# Patient Record
Sex: Male | Born: 1960 | Race: Black or African American | Hispanic: No | Marital: Married | Smoking: Never smoker
Health system: Southern US, Community
[De-identification: ages and names within clinical notes are randomized; demographics above are authoritative.]

## PROBLEM LIST (undated history)

## (undated) DIAGNOSIS — J302 Other seasonal allergic rhinitis: Secondary | ICD-10-CM

## (undated) DIAGNOSIS — I10 Essential (primary) hypertension: Secondary | ICD-10-CM

## (undated) HISTORY — PX: HERNIA REPAIR: SHX51

## (undated) HISTORY — DX: Essential (primary) hypertension: I10

## (undated) HISTORY — PX: OTHER SURGICAL HISTORY: SHX169

---

## 2014-06-20 ENCOUNTER — Emergency Department: Payer: Self-pay | Admitting: Emergency Medicine
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
## 2014-06-23 ENCOUNTER — Emergency Department: Payer: Self-pay | Admitting: Emergency Medicine
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
## 2014-11-16 ENCOUNTER — Emergency Department: Payer: Self-pay | Admitting: Emergency Medicine

## 2014-11-16 LAB — CBC
HCT: 39.8 % — ABNORMAL LOW (ref 40.0–52.0)
HGB: 12.6 g/dL — ABNORMAL LOW (ref 13.0–18.0)
MCH: 24.9 pg — AB (ref 26.0–34.0)
MCHC: 31.6 g/dL — ABNORMAL LOW (ref 32.0–36.0)
MCV: 79 fL — ABNORMAL LOW (ref 80–100)
Platelet: 261 10*3/uL (ref 150–440)
RBC: 5.07 10*6/uL (ref 4.40–5.90)
RDW: 15.8 % — ABNORMAL HIGH (ref 11.5–14.5)
WBC: 4.5 10*3/uL (ref 3.8–10.6)

## 2014-11-16 IMAGING — CR DG CHEST 2V
1 series · 2 of 2 positions shown · non-contrast
Comparison: [DATE].

CLINICAL DATA: Chest pain radiating to the back. Initial encounter.

[Series 1: dxr chest pa (or ap) and lateral · 0.14mm/px · 2 of 2 slices shown]
[im 1/2]
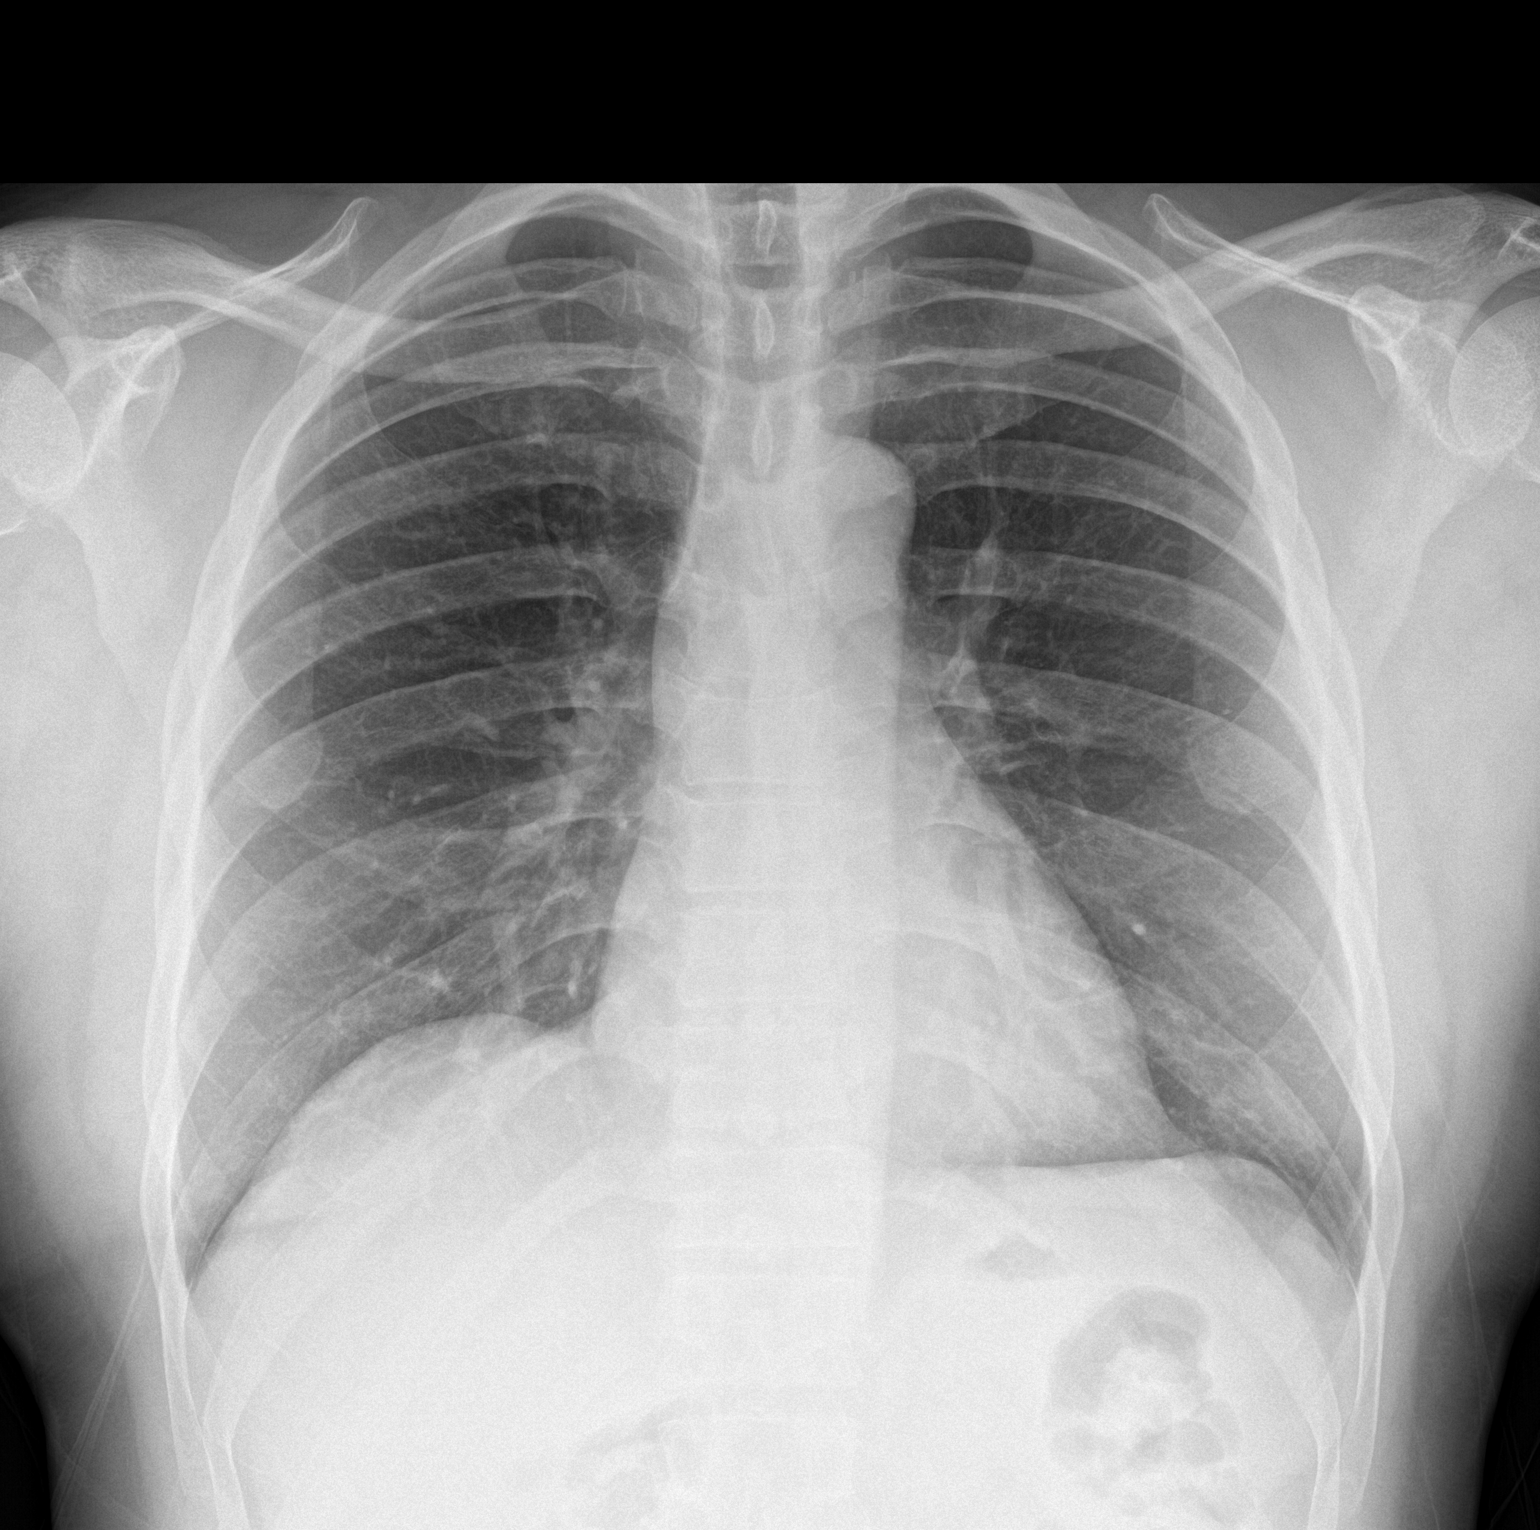
[im 2/2]
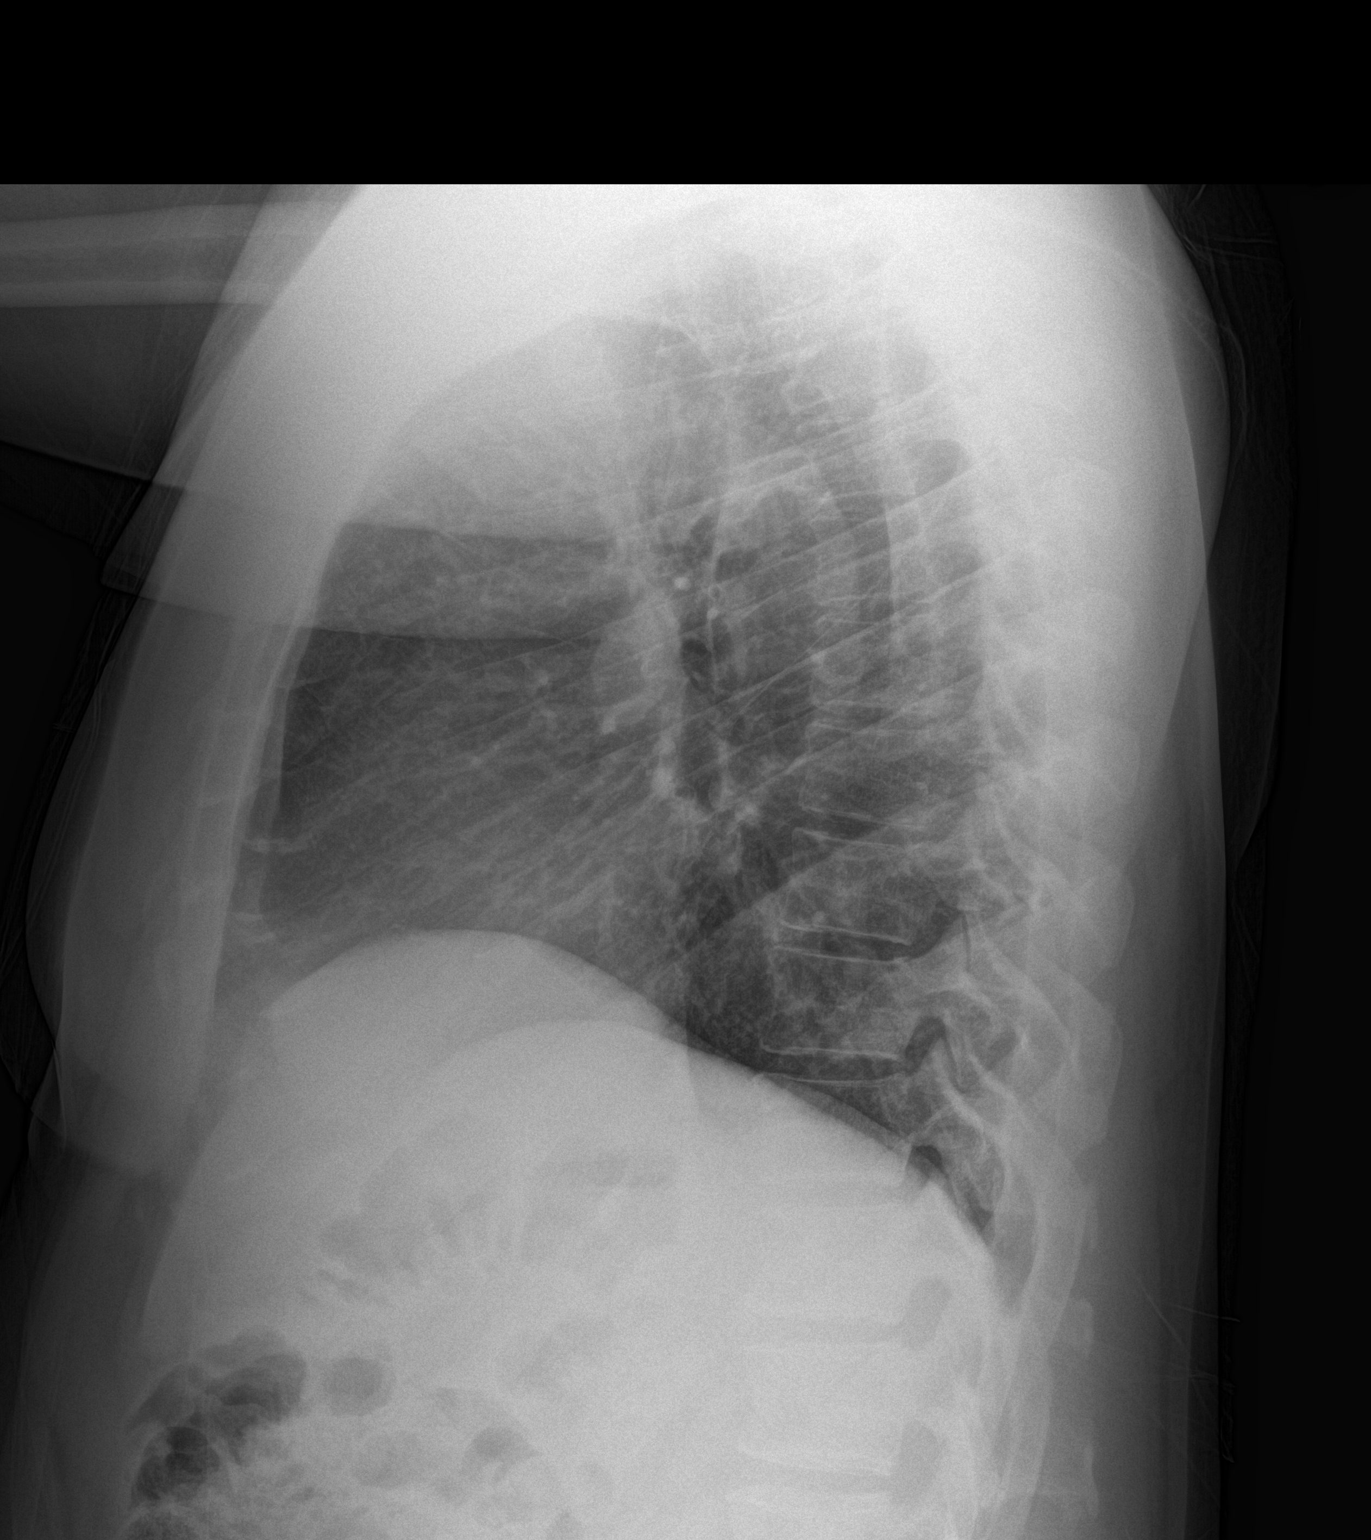

[2 of 2 positions shown; findings below may reference images not displayed]

radiates through his chest to his back. Patient began having
increased chest pain earlier today and pain became more centralized
along his sternum. Patient states he had flu like symptoms 3 weeks
ago that came and went very quickly but no other HX of any heart or
lung disease in the past. Patient is a nonsmoker.

EXAM:
CHEST  2 VIEW
FINDINGS: Cardiopericardial silhouette within normal limits. Mediastinal
contours normal. Trachea midline. No airspace disease or effusion.
IMPRESSION: No active cardiopulmonary disease.

## 2015-03-13 IMAGING — US US ABDOMEN LIMITED
1 series · 14 of 25 positions shown · non-contrast
Comparison: None.

CLINICAL DATA: Acute onset of severe right upper quadrant and
epigastric abdominal pain. Nausea and vomiting. Initial encounter.

EXAM:
US ABDOMEN LIMITED - RIGHT UPPER QUADRANT

[Series 1: us abdomen limited · 0.25mm/px · 14 of 37 slices shown]
[im 1/37]
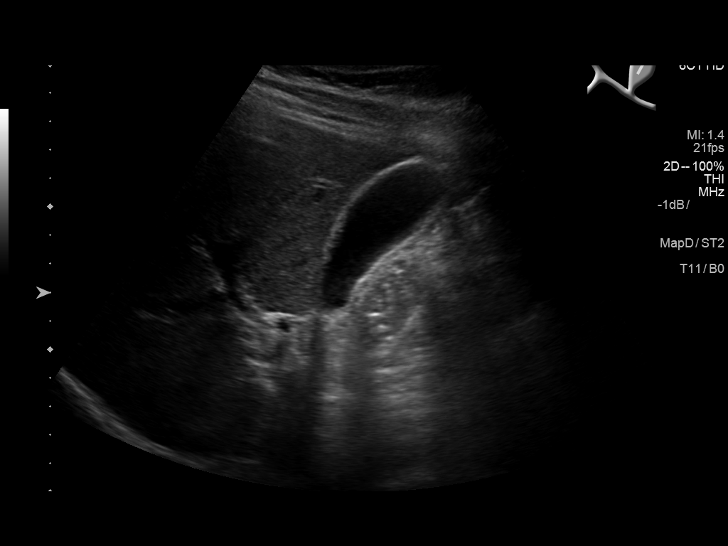
[im 4/37]
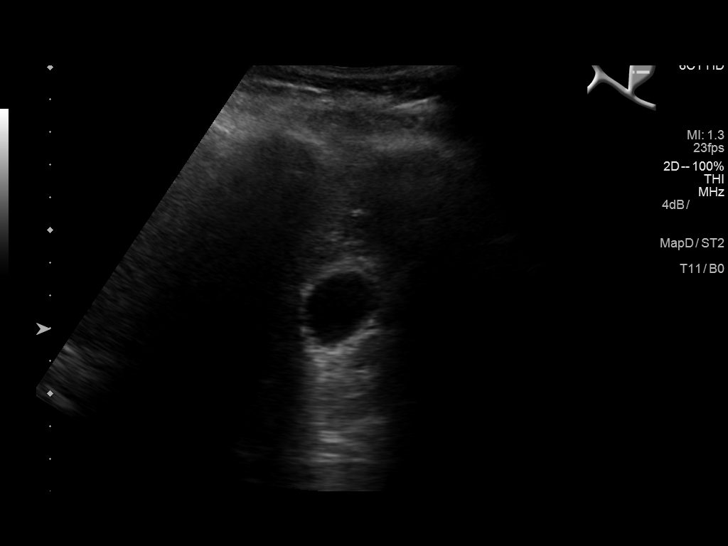
[im 7/37]
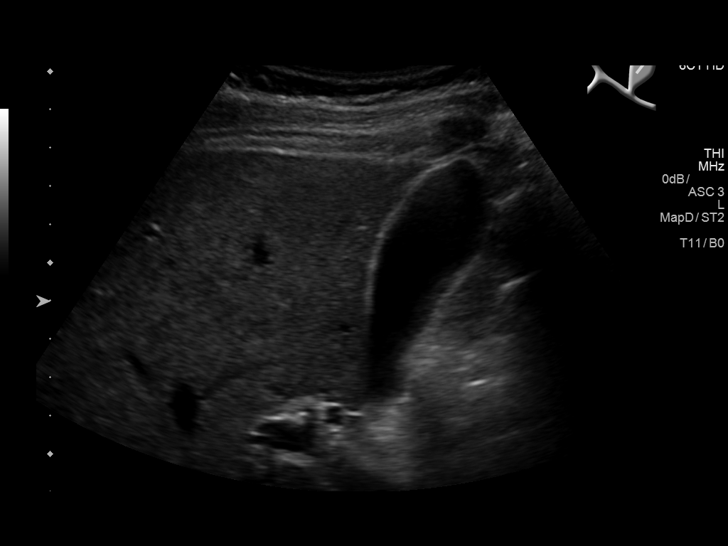
[im 10/37]
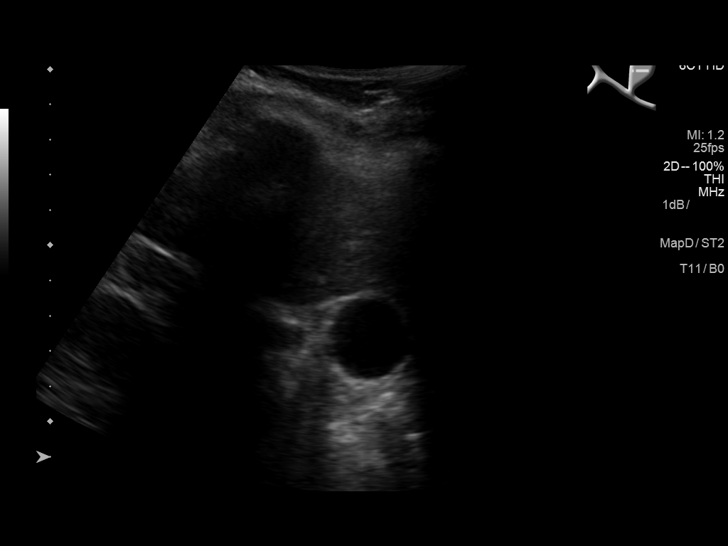
[im 13/37]
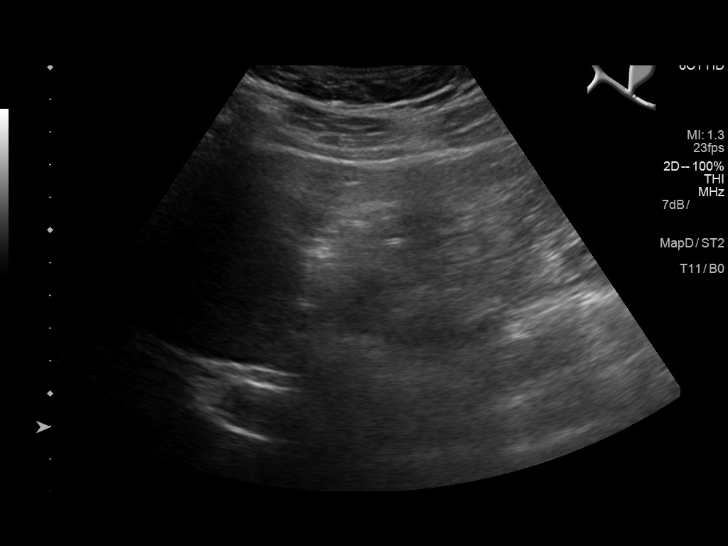
[im 14/37]
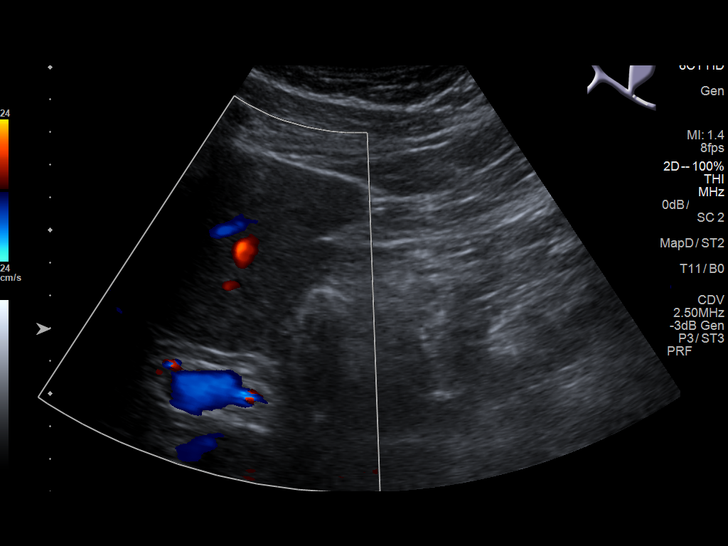
[im 17/37]
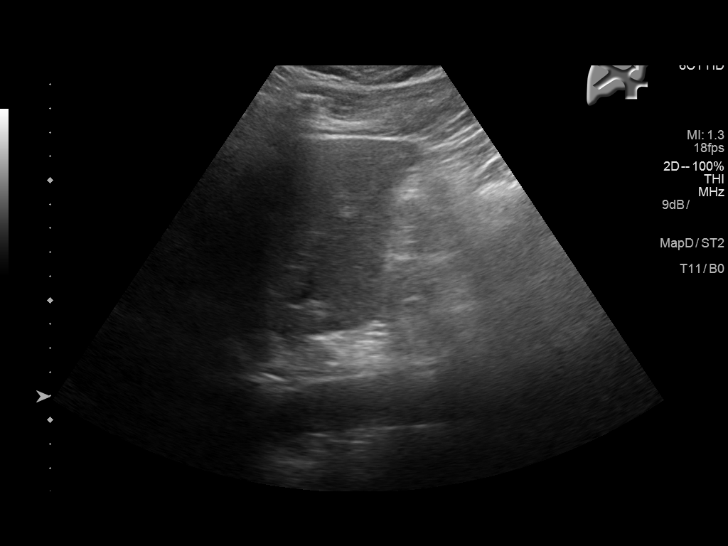
[im 20/37]
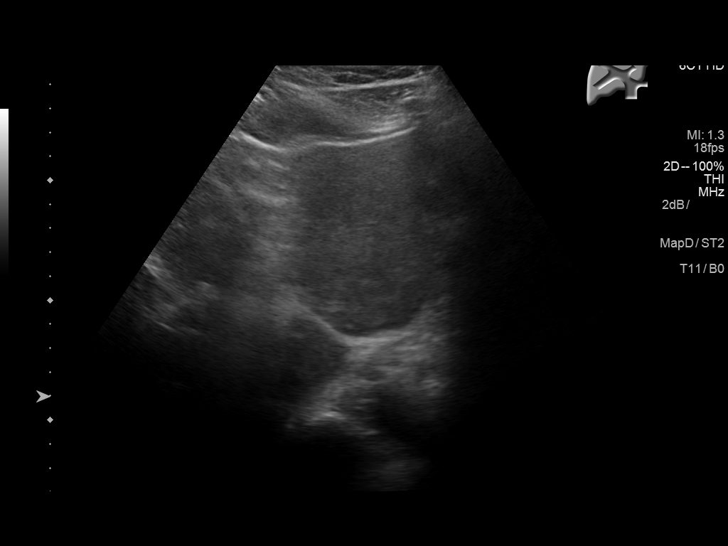
[im 23/37]
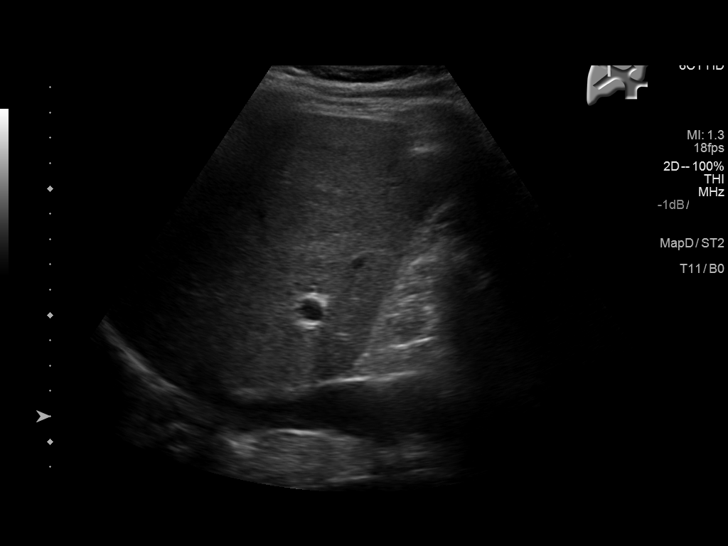
[im 25/37]
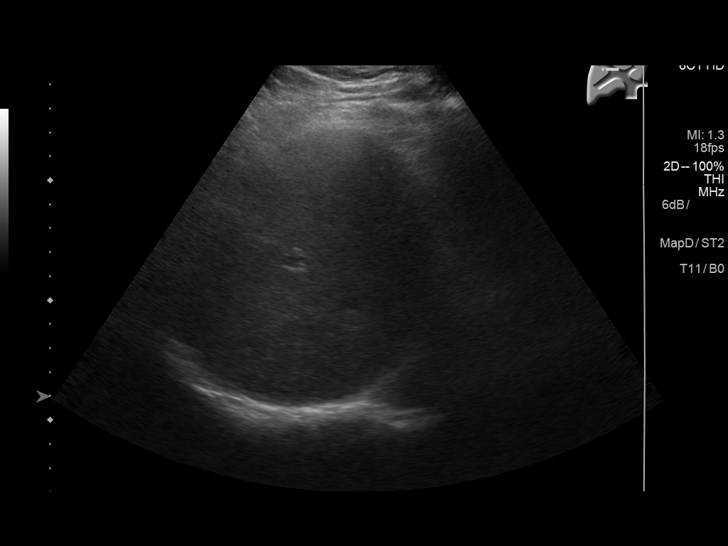
[im 28/37]
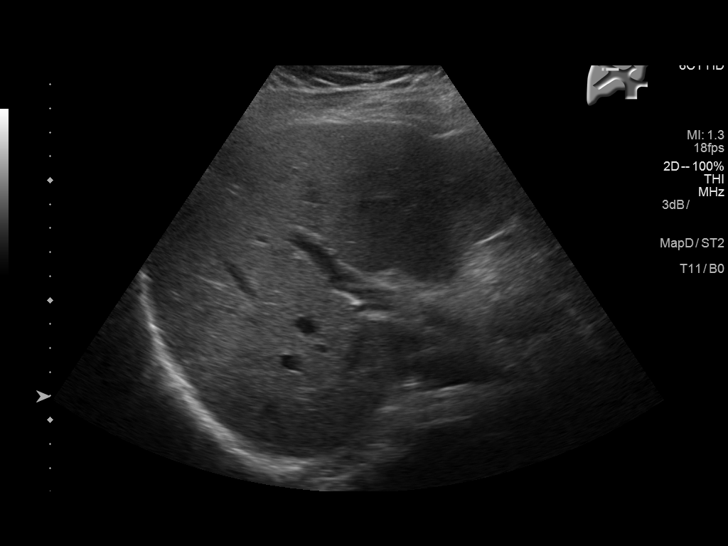
[im 31/37]
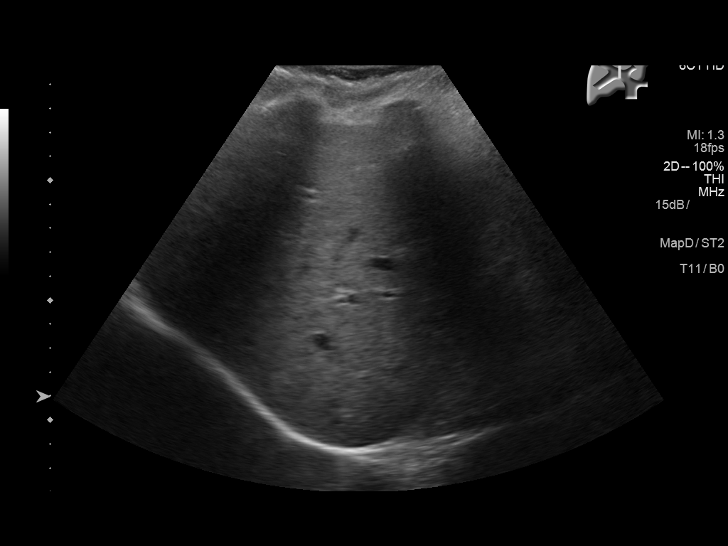
[im 34/37]
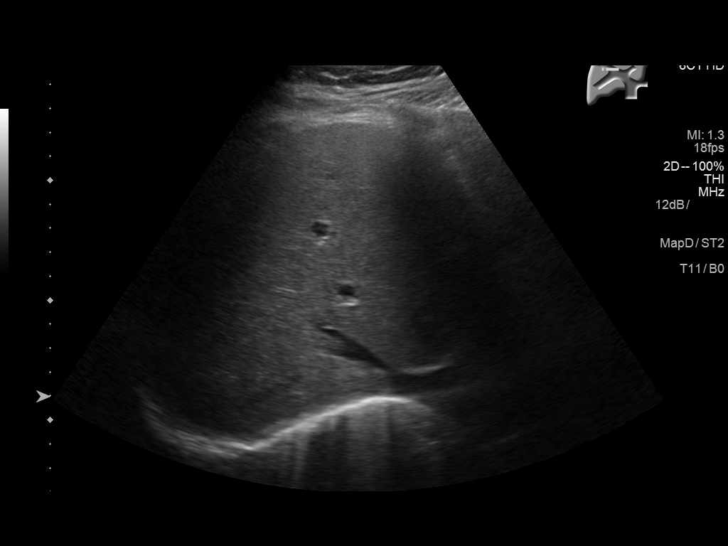
[im 37/37]
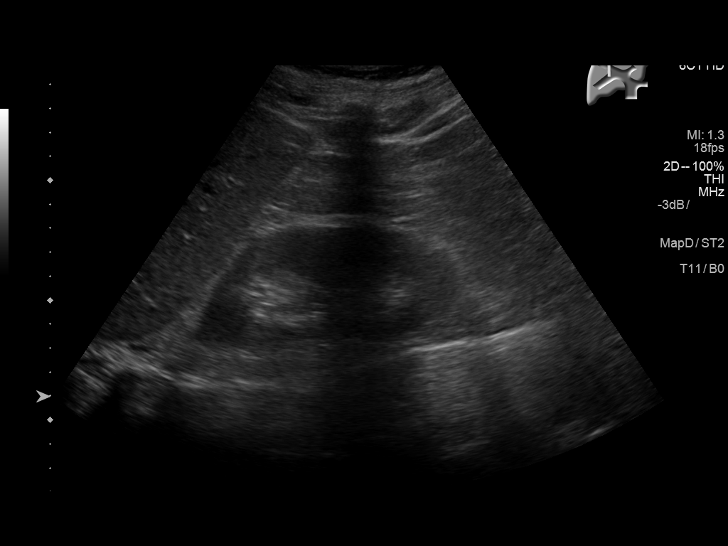

[14 of 25 positions shown; findings below may reference images not displayed]

FINDINGS: Gallbladder:

No gallstones or wall thickening visualized. No sonographic Murphy
sign noted by sonographer.

Common bile duct:

Diameter: 0.4 cm, within normal limits in caliber.

Liver:

No focal lesion identified. Within normal limits in parenchymal
echogenicity.
IMPRESSION: Unremarkable ultrasound of the right upper quadrant.

## 2015-04-03 LAB — BASIC METABOLIC PANEL
ANION GAP: 8 (ref 7–16)
BUN: 17 mg/dL
CREATININE: 1.12 mg/dL
Calcium, Total: 8.7 mg/dL — ABNORMAL LOW
Chloride: 104 mmol/L
Co2: 25 mmol/L
EGFR (Non-African Amer.): >60
GLUCOSE: 100 mg/dL — AB
Potassium: 4 mmol/L
Sodium: 137 mmol/L

## 2015-04-03 LAB — TROPONIN I

## 2015-04-03 LAB — PRO B NATRIURETIC PEPTIDE: B-TYPE NATIURETIC PEPTID: 17 pg/mL

## 2015-07-21 ENCOUNTER — Emergency Department
Admission: EM | Admit: 2015-07-21 | Discharge: 2015-07-21 | Disposition: A | Discharge status: Home / Self Care | Payer: BC Managed Care – PPO | Attending: Emergency Medicine | Admitting: Emergency Medicine

## 2015-07-21 DIAGNOSIS — R0989 Other specified symptoms and signs involving the circulatory and respiratory systems: Secondary | ICD-10-CM | POA: Diagnosis present

## 2015-07-21 DIAGNOSIS — B349 Viral infection, unspecified: Secondary | ICD-10-CM | POA: Insufficient documentation

## 2015-07-21 NOTE — ED Notes (Signed)
Pt c/o cough with congestion for the past week

## 2015-07-21 NOTE — ED Notes (Signed)
AAOx3.  Skin warm and dry. MAE equally and strong.

## 2015-07-21 NOTE — ED Provider Notes (Signed)
[facility] Emergency Department Provider Note  ____________________________________________  Time seen: On arrival  I have reviewed the triage vital signs and the nursing notes.   HISTORY  Chief Complaint URI    HPI Corey Patterson is a 54 y.o. male who presents with mild ear discomfort, scratchy throat, body aches and low energy for approximately one week. He does not know what causing this. He denies recent travel, no sick contacts. No fevers or chills. No nausea vomiting abdominal pain.    History reviewed. No pertinent past medical history.  There are no active problems to display for this patient.   Past Surgical History  Procedure Laterality Date  . Hernia repair      No current outpatient prescriptions on file.  Allergies Review of patient's allergies indicates no known allergies.  No family history on file.  Social History Social History  Substance Use Topics  . Smoking status: Never Smoker   . Smokeless tobacco: None  . Alcohol Use: No    Review of Systems  Constitutional: Negative for fever. Eyes: Negative for visual changes. ENT: Positive for scratchy throat  Abdominal: No abdominal pain  Musculoskeletal: Negative for back pain. Positive for myalgias Skin: Negative for rash. Neurological: Negative for headaches or focal weakness   ____________________________________________   PHYSICAL EXAM:  VITAL SIGNS: ED Triage Vitals  Enc Vitals Group     BP 07/21/15 1600 146/76 mmHg     Pulse Rate 07/21/15 1600 95     Resp 07/21/15 1600 18     Temp 07/21/15 1600 98.6 F (37 C)     Temp Source 07/21/15 1600 Oral     SpO2 07/21/15 1600 98 %     Weight 07/21/15 1600 226 lb (102.513 kg)     Height 07/21/15 1600 5\' 11"  (1.803 m)     Head Cir --      Peak Flow --      Pain Score 07/21/15 1601 2     Pain Loc --      Pain Edu? --      Excl. in GC? --      Constitutional: Alert and oriented. Well appearing and in no  distress. Eyes: Conjunctivae are normal.  ENT   Head: Normocephalic and atraumatic.   Mouth/Throat: Mucous membranes are moist. Cardiovascular: Normal rate, regular rhythm.  Respiratory: Normal respiratory effort without tachypnea nor retractions.  Gastrointestinal: Soft and non-tender in all quadrants. No distention. There is no CVA tenderness. Musculoskeletal: Nontender with normal range of motion in all extremities. Neurologic:  Normal speech and language. No gross focal neurologic deficits are appreciated. Skin:  Skin is warm, dry and intact. No rash noted. Psychiatric: Mood and affect are normal. Patient exhibits appropriate insight and judgment.  ____________________________________________    LABS (pertinent positives/negatives)  Labs Reviewed - No data to display  ____________________________________________     ____________________________________________    RADIOLOGY I have personally reviewed any xrays that were ordered on this patient: None  ____________________________________________   PROCEDURES  Procedure(s) performed: none   ____________________________________________   INITIAL IMPRESSION / ASSESSMENT AND PLAN / ED COURSE  Pertinent labs & imaging results that were available during my care of the patient were reviewed by me and considered in my medical decision making (see chart for details).  Patient well-appearing and in no distress. All vitals are unremarkable. Clear cause of his symptoms although certainly sounds like viremia recommended outpatient follow-up. Return precautions given ____________________________________________   FINAL CLINICAL IMPRESSION(S) / ED DIAGNOSES  Final diagnoses:  Viral illness     Jene Every, MD 07/21/15 1755

## 2016-07-14 ENCOUNTER — Encounter: Payer: Self-pay | Admitting: Emergency Medicine

## 2016-07-14 ENCOUNTER — Emergency Department
Admission: EM | Admit: 2016-07-14 | Discharge: 2016-07-15 | Disposition: A | Discharge status: Home / Self Care | Payer: BC Managed Care – PPO | Attending: Emergency Medicine | Admitting: Emergency Medicine

## 2016-07-14 DIAGNOSIS — R0789 Other chest pain: Secondary | ICD-10-CM | POA: Insufficient documentation

## 2016-07-14 DIAGNOSIS — R1011 Right upper quadrant pain: Secondary | ICD-10-CM | POA: Diagnosis not present

## 2016-07-14 DIAGNOSIS — R1010 Upper abdominal pain, unspecified: Secondary | ICD-10-CM

## 2016-07-14 DIAGNOSIS — R112 Nausea with vomiting, unspecified: Secondary | ICD-10-CM | POA: Insufficient documentation

## 2016-07-14 DIAGNOSIS — R197 Diarrhea, unspecified: Secondary | ICD-10-CM | POA: Diagnosis not present

## 2016-07-14 DIAGNOSIS — R079 Chest pain, unspecified: Secondary | ICD-10-CM | POA: Diagnosis present

## 2016-07-14 IMAGING — CR DG CHEST 2V
1 series · 2 of 2 positions shown · non-contrast
Comparison: [DATE]

CLINICAL DATA: Patient woke from sleep with chest pain, nausea, and
vomiting.

EXAM:
CHEST  2 VIEW

[Series 1: dg chest 2 view · 0.14mm/px · 2 of 2 slices shown]
[im 1/2]
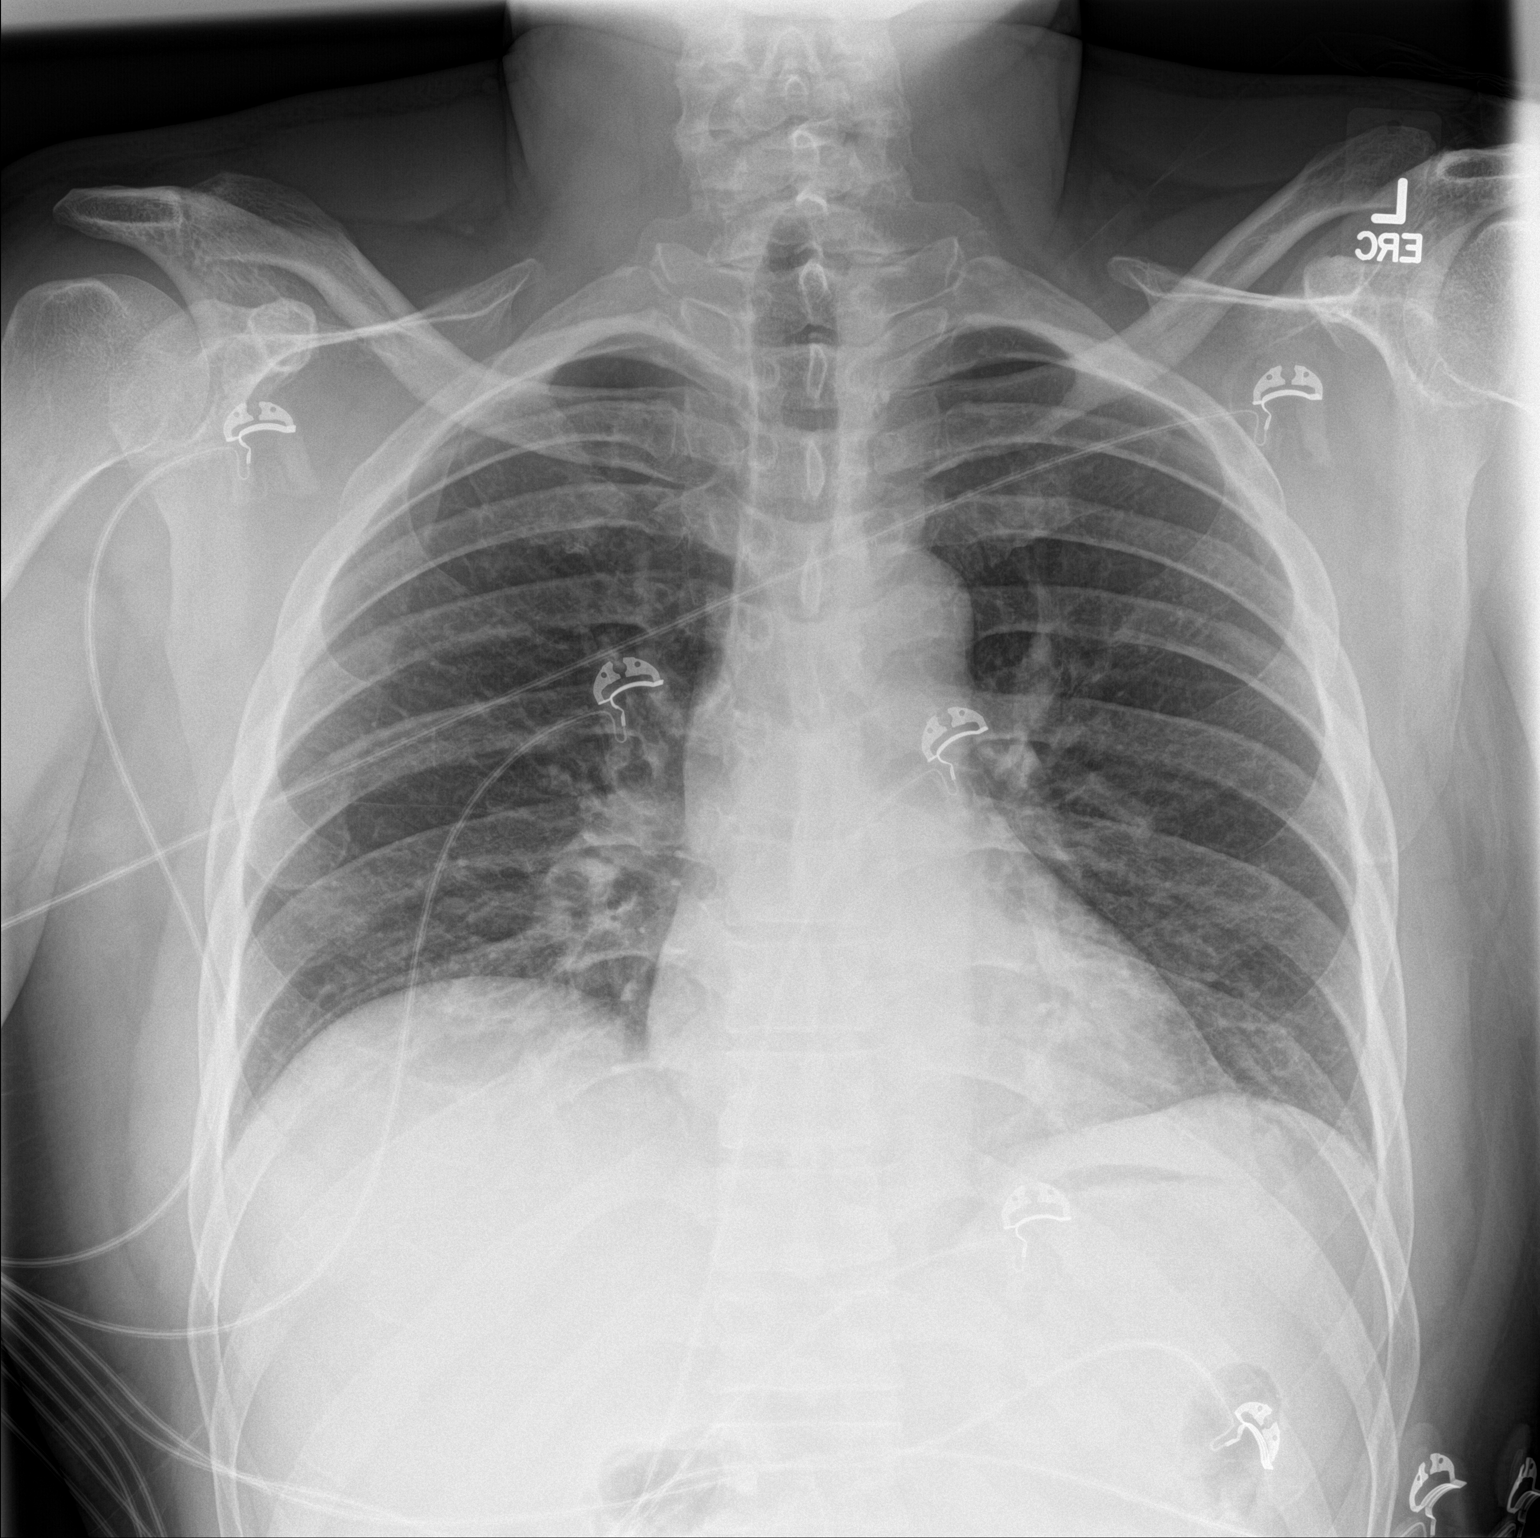
[im 2/2]
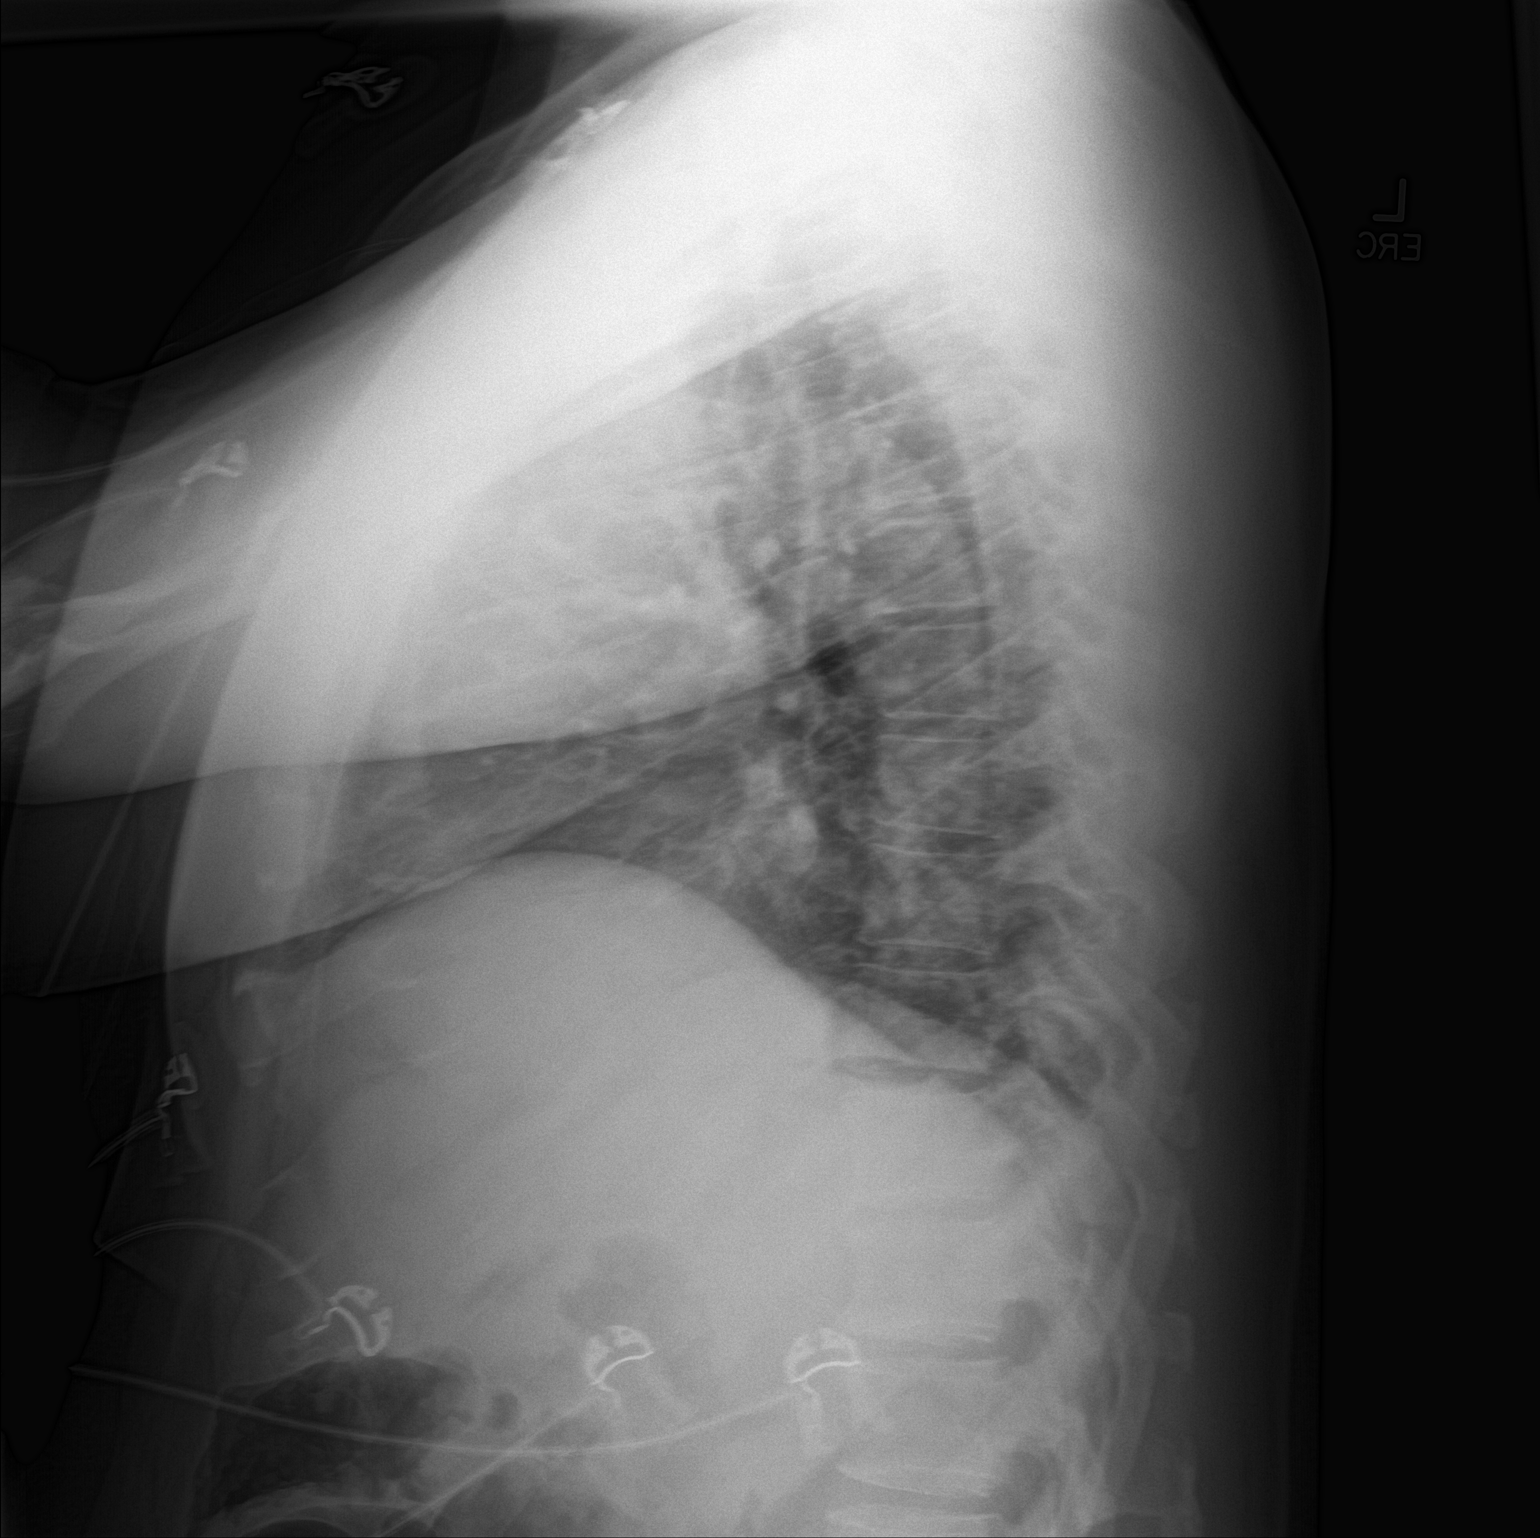

[2 of 2 positions shown; findings below may reference images not displayed]

FINDINGS: Shallow inspiration. Suggestion of bronchiectasis and peribronchial
thickening in the lung bases. No focal consolidation or airspace
disease. No blunting of costophrenic angles. No pneumothorax.
Mediastinal contours appear intact. Normal heart size and pulmonary
vascularity. Examination is technically limited due to motion
artifact on the lateral view.
IMPRESSION: Probable chronic bronchitic changes in the lungs. No evidence of
active pulmonary disease peer

## 2016-07-14 NOTE — ED Triage Notes (Signed)
Patient states that he was awaken from sleep with chest pain, nausea and vomiting. When assisting patient out of car he dropped to his knees in parking lot.

## 2016-07-15 ENCOUNTER — Emergency Department: Payer: BC Managed Care – PPO

## 2016-07-15 LAB — HEPATIC FUNCTION PANEL
ALBUMIN: 4 g/dL (ref 3.5–5.0)
ALK PHOS: 91 U/L (ref 38–126)
ALT: 66 U/L — AB (ref 17–63)
AST: 100 U/L — AB (ref 15–41)
BILIRUBIN TOTAL: 0.6 mg/dL (ref 0.3–1.2)
Bilirubin, Direct: 0.2 mg/dL (ref 0.1–0.5)
Indirect Bilirubin: 0.4 mg/dL (ref 0.3–0.9)
Total Protein: 8 g/dL (ref 6.5–8.1)

## 2016-07-15 LAB — BASIC METABOLIC PANEL
ANION GAP: 5 (ref 5–15)
BUN: 12 mg/dL (ref 6–20)
CHLORIDE: 106 mmol/L (ref 101–111)
CO2: 28 mmol/L (ref 22–32)
CREATININE: 1.14 mg/dL (ref 0.61–1.24)
Calcium: 8.5 mg/dL — ABNORMAL LOW (ref 8.9–10.3)
GFR calc non Af Amer: >60 mL/min (ref >60)
Glucose, Bld: 123 mg/dL — ABNORMAL HIGH (ref 65–99)
POTASSIUM: 3.6 mmol/L (ref 3.5–5.1)
Sodium: 139 mmol/L (ref 135–145)

## 2016-07-15 LAB — CBC
HEMATOCRIT: 45.7 % (ref 40.0–52.0)
HEMOGLOBIN: 15.1 g/dL (ref 13.0–18.0)
MCH: 25.9 pg — AB (ref 26.0–34.0)
MCHC: 33 g/dL (ref 32.0–36.0)
MCV: 78.7 fL — AB (ref 80.0–100.0)
Platelets: 220 10*3/uL (ref 150–440)
RBC: 5.81 MIL/uL (ref 4.40–5.90)
RDW: 16.5 % — ABNORMAL HIGH (ref 11.5–14.5)
WBC: 9.3 10*3/uL (ref 3.8–10.6)

## 2016-07-15 LAB — LIPASE, BLOOD: Lipase: 25 U/L (ref 11–51)

## 2016-07-15 LAB — TROPONIN I
Troponin I: <0.03 ng/mL (ref <0.03)
Troponin I: <0.03 ng/mL (ref <0.03)

## 2016-07-15 MED ORDER — ONDANSETRON HCL 4 MG/2ML IJ SOLN
4.0000 mg | Freq: Once | INTRAMUSCULAR | Status: AC
  Administered 2016-07-15: 4 mg via INTRAVENOUS
  Filled 2016-07-15: qty 2

## 2016-07-15 MED ORDER — MORPHINE SULFATE (PF) 4 MG/ML IV SOLN
4.0000 mg | Freq: Once | INTRAVENOUS | Status: AC
  Administered 2016-07-15: 4 mg via INTRAVENOUS
  Filled 2016-07-15: qty 1

## 2016-07-15 NOTE — ED Notes (Signed)
Patient transported to Ultrasound via stretcher; wife waiting in room; she declined offer of beverage

## 2016-07-15 NOTE — ED Notes (Signed)
Attempt at blood draw to left forearm unsuccessful; pt tolerated well

## 2016-07-15 NOTE — ED Notes (Signed)
Patient transported to X-ray via stretcher by Eddie 

## 2016-07-15 NOTE — Discharge Instructions (Signed)
As we discussed, your workup today was reassuring.  Though we do not know exactly what is causing your symptoms, it appears that you have no emergent medical condition at this time and that you are safe to go home and follow up as recommended in this paperwork. ° °Please return immediately to the Emergency Department if you develop any new or worsening symptoms that concern you. ° °

## 2016-07-15 NOTE — ED Provider Notes (Signed)
[facility] Emergency Department Provider Note  ____________________________________________   None    (approximate)  I have reviewed the triage vital signs and the nursing notes.   HISTORY  Chief Complaint Chest Pain and Emesis    HPI Corey Patterson is a 55 y.o. male with no significant past medical history who presents with acute onset severe epigastric pain that is radiating into his chest.  He and his family report that he had dinner earlier tonight, about  7 hours ago, and then he went to bed.  He awoke with severe pain and at least one episode of emesis.  He is uncertain if it is coming from his chest or his upper abdomen but when I talked to him he complained of his abdomen hurting.  He has never had any similar symptoms in the past.  He denies fever/chills, shortness of breath, dysuria.  He has also had at least one loose stool since the pain began.  The symptoms are severe and the patient was shuddering and reportedly collapsed when he arrived by private vehicle and had to be brought in by wheelchair.  History reviewed. No pertinent past medical history.  There are no active problems to display for this patient.   Past Surgical History:  Procedure Laterality Date  . HERNIA REPAIR      Prior to Admission medications   Medication Sig Start Date End Date Taking? Authorizing Provider  Multiple Vitamin (MULTIVITAMIN WITH MINERALS) TABS tablet Take 1 tablet by mouth daily.   Yes Historical Provider, MD    Allergies Patient has no known allergies.  No family history on file.  Social History Social History  Substance Use Topics  . Smoking status: Never Smoker  . Smokeless tobacco: Never Used  . Alcohol use No    Review of Systems Constitutional: No fever/chills Eyes: No visual changes. ENT: No sore throat. Cardiovascular: Chest pain versus upper abdominal pain Respiratory: Denies shortness of breath. Gastrointestinal: Upper abdominal  pain.  Nausea with at least one episode of vomiting and at least one episode of loose stools Genitourinary: Negative for dysuria. Musculoskeletal: Negative for back pain. Skin: Negative for rash. Neurological: Negative for headaches, focal weakness or numbness.  10-point ROS otherwise negative.  ____________________________________________   PHYSICAL EXAM:  VITAL SIGNS: ED Triage Vitals  Enc Vitals Group     BP 07/14/16 2333 (!) 164/106     Pulse Rate 07/14/16 2333 81     Resp 07/14/16 2333 18     Temp 07/14/16 2333 97.9 F (36.6 C)     Temp src --      SpO2 07/14/16 2333 99 %     Weight 07/14/16 2334 220 lb (99.8 kg)     Height 07/14/16 2334 6' (1.829 m)     Head Circumference --      Peak Flow --      Pain Score 07/14/16 2334 9     Pain Loc --      Pain Edu? --      Excl. in GC? --     Constitutional: Alert and oriented. No acute respiratory distress, does appear uncomfortable after morphine Eyes: Conjunctivae are normal. PERRL. EOMI. Head: Atraumatic. Nose: No congestion/rhinnorhea. Mouth/Throat: Mucous membranes are moist.  Oropharynx non-erythematous. Neck: No stridor.  No meningeal signs.   Cardiovascular: Normal rate, regular rhythm. Good peripheral circulation. Grossly normal heart sounds. Respiratory: Normal respiratory effort.  No retractions. Lungs CTAB.  Patient complains of tenderness to gentle palpation of his  chest wall Gastrointestinal: Soft with diffuse abdominal tenderness throughout but severe tenderness to palpation of the epigastrium and the right upper quadrant with positive Murphy sign.  No focal tenderness at the right lower quadrant (McBurney's point) Musculoskeletal: No lower extremity tenderness nor edema. No gross deformities of extremities. Neurologic:  Normal speech and language. No gross focal neurologic deficits are appreciated.  Skin:  Skin is warm, dry and intact. No rash noted. Psychiatric: Mood and affect are normal. Speech and behavior  are normal.  ____________________________________________   LABS (all labs ordered are listed, but only abnormal results are displayed)  Labs Reviewed  BASIC METABOLIC PANEL - Abnormal; Notable for the following:       Result Value   Glucose, Bld 123 (*)    Calcium 8.5 (*)    All other components within normal limits  CBC - Abnormal; Notable for the following:    MCV 78.7 (*)    MCH 25.9 (*)    RDW 16.5 (*)    All other components within normal limits  HEPATIC FUNCTION PANEL - Abnormal; Notable for the following:    AST 100 (*)    ALT 66 (*)    All other components within normal limits  TROPONIN I  LIPASE, BLOOD  TROPONIN I   ____________________________________________  EKG  ED ECG REPORT I, Mckinley Olheiser, the attending physician, personally viewed and interpreted this ECG.  Date: 07/15/2016 EKG Time: 23:29 Rate: 83 Rhythm: normal sinus rhythm with first-degree AV block QRS Axis: normal Intervals: Normal except for PR interval of 236 ms ST/T Wave abnormalities: normal Conduction Disturbances: none Narrative Interpretation: unremarkable  ____________________________________________  RADIOLOGY   Dg Chest 2 View  Result Date: 07/15/2016 CLINICAL DATA:  Patient woke from sleep with chest pain, nausea, and vomiting. EXAM: CHEST  2 VIEW COMPARISON:  11/16/2014 FINDINGS: Shallow inspiration. Suggestion of bronchiectasis and peribronchial thickening in the lung bases. No focal consolidation or airspace disease. No blunting of costophrenic angles. No pneumothorax. Mediastinal contours appear intact. Normal heart size and pulmonary vascularity. Examination is technically limited due to motion artifact on the lateral view. IMPRESSION: Probable chronic bronchitic changes in the lungs. No evidence of active pulmonary disease peer Electronically Signed   By: Burman Nieves M.D.   On: 07/15/2016 00:22   US Abdomen Limited Ruq  Result Date: 07/15/2016 CLINICAL DATA:  Acute  onset of severe right upper quadrant and epigastric abdominal pain. Nausea and vomiting. Initial encounter. EXAM: US ABDOMEN LIMITED - RIGHT UPPER QUADRANT COMPARISON:  None. FINDINGS: Gallbladder: No gallstones or wall thickening visualized. No sonographic Murphy sign noted by sonographer. Common bile duct: Diameter: 0.4 cm, within normal limits in caliber. Liver: No focal lesion identified. Within normal limits in parenchymal echogenicity. IMPRESSION: Unremarkable ultrasound of the right upper quadrant. Electronically Signed   By: Roanna Raider M.D.   On: 07/15/2016 02:06    ____________________________________________   PROCEDURES  Procedure(s) performed:   Procedures   Critical Care performed: No ____________________________________________   INITIAL IMPRESSION / ASSESSMENT AND PLAN / ED COURSE  Pertinent labs & imaging results that were available during my care of the patient were reviewed by me and considered in my medical decision making (see chart for details).  The patient presented quite dramatically to the emergency department but feels more comfortable after the morphine.  His labs are only notable for very slight elevation of his AST and ALT.  His physical exam suggests biliary colic.  He has no fever at this time which is  reassuring in terms of ruling out cholangitis but cholelithiasis and/or cholecystitis of both possible.  We will evaluate with a right upper quadrant ultrasound.  He has been nothing by mouth for 6-7 hours.   Clinical Course as of Jul 15 500  Mon Jul 15, 2016  0226 US Abdomen Limited RUQ [CF]  0239 The patient is fast asleep.  He woke up and talk to me a bed and then fell asleep in the middle of the discussion when I was talking to him and his wife about the results of his ultrasound.  We discussed the slightly elevated AST and ALT but this is of unknown clinical significance.  It may be related to his episode of vomiting and diarrhea.  Regardless his  pain seems to have resolved or at least greatly diminished and he is now getting some sleep.  I discussed additional workup such as imaging with a CT scan with his wife but we agreed this point to just repeat a troponin at the 3 hour mark given how comfortable he is at this time.  I will reassess as needed.  [CF]  [id number] Patient is very comfortable, awake and alert, in no acute distress.  I told him about the negative troponin and he is comfortable going home.  He asked for a work note for tomorrow which I will provide.  I gave my usual and customary return precautions.  [CF]    Clinical Course User Index [CF] Loleta Roseory Logyn Kendrick, MD    ____________________________________________  FINAL CLINICAL IMPRESSION(S) / ED DIAGNOSES  Final diagnoses:  Atypical chest pain  Upper abdominal pain     MEDICATIONS GIVEN DURING THIS VISIT:  Medications  morphine 4 MG/ML injection 4 mg (4 mg Intravenous Given 07/15/16 0010)  ondansetron (ZOFRAN) injection 4 mg (4 mg Intravenous Given 07/15/16 0010)     NEW OUTPATIENT MEDICATIONS STARTED DURING THIS VISIT:  New Prescriptions   No medications on file    Modified Medications   No medications on file    Discontinued Medications   No medications on file     Note:  This document was prepared using Dragon voice recognition software and may include unintentional dictation errors.    Loleta Roseory [place], MD 07/15/16 [phone]

## 2016-07-15 NOTE — ED Notes (Signed)
Pt resting quietly; wife at bedside; awaiting MD follow up; given more to drink;

## 2016-07-15 NOTE — ED Notes (Signed)
Pt's wife, daughter and mother at bedside; pt trembling, warm blankets provided; pt noted to stop shaking to look over at family, and then started trembling again;

## 2016-07-15 NOTE — ED Notes (Signed)
Pt back from US

## 2021-12-11 ENCOUNTER — Emergency Department (HOSPITAL_COMMUNITY): Payer: BC Managed Care – PPO

## 2021-12-11 ENCOUNTER — Emergency Department (HOSPITAL_COMMUNITY)
Admission: EM | Admit: 2021-12-11 | Discharge: 2021-12-12 | Disposition: A | Discharge status: Home / Self Care | Payer: BC Managed Care – PPO | Attending: Emergency Medicine | Admitting: Emergency Medicine

## 2021-12-11 DIAGNOSIS — Z7982 Long term (current) use of aspirin: Secondary | ICD-10-CM | POA: Diagnosis not present

## 2021-12-11 DIAGNOSIS — R7309 Other abnormal glucose: Secondary | ICD-10-CM | POA: Diagnosis not present

## 2021-12-11 DIAGNOSIS — M542 Cervicalgia: Secondary | ICD-10-CM | POA: Diagnosis present

## 2021-12-11 DIAGNOSIS — R531 Weakness: Secondary | ICD-10-CM | POA: Diagnosis not present

## 2021-12-11 DIAGNOSIS — R519 Headache, unspecified: Secondary | ICD-10-CM | POA: Diagnosis not present

## 2021-12-11 DIAGNOSIS — Z20822 Contact with and (suspected) exposure to covid-19: Secondary | ICD-10-CM | POA: Insufficient documentation

## 2021-12-11 DIAGNOSIS — M4802 Spinal stenosis, cervical region: Secondary | ICD-10-CM

## 2021-12-11 DIAGNOSIS — D649 Anemia, unspecified: Secondary | ICD-10-CM | POA: Diagnosis not present

## 2021-12-11 DIAGNOSIS — R2990 Unspecified symptoms and signs involving the nervous system: Secondary | ICD-10-CM

## 2021-12-11 DIAGNOSIS — N289 Disorder of kidney and ureter, unspecified: Secondary | ICD-10-CM

## 2021-12-11 LAB — URINALYSIS, ROUTINE W REFLEX MICROSCOPIC
Bilirubin Urine: NEGATIVE
Glucose, UA: NEGATIVE mg/dL
Hgb urine dipstick: NEGATIVE
Ketones, ur: NEGATIVE mg/dL
Leukocytes,Ua: NEGATIVE
Nitrite: NEGATIVE
Protein, ur: NEGATIVE mg/dL
Specific Gravity, Urine: 1.026 (ref 1.005–1.030)
pH: 5 (ref 5.0–8.0)

## 2021-12-11 LAB — DIFFERENTIAL
Abs Immature Granulocytes: 0.01 10*3/uL (ref 0.00–0.07)
Basophils Absolute: 0 10*3/uL (ref 0.0–0.1)
Basophils Relative: 1 %
Eosinophils Absolute: 0.3 10*3/uL (ref 0.0–0.5)
Eosinophils Relative: 4 %
Immature Granulocytes: 0 %
Lymphocytes Relative: 34 %
Lymphs Abs: 2 10*3/uL (ref 0.7–4.0)
Monocytes Absolute: 0.5 10*3/uL (ref 0.1–1.0)
Monocytes Relative: 9 %
Neutro Abs: 3.1 10*3/uL (ref 1.7–7.7)
Neutrophils Relative %: 52 %

## 2021-12-11 LAB — I-STAT CHEM 8, ED
BUN: 19 mg/dL (ref 6–20)
Calcium, Ion: 1.09 mmol/L — ABNORMAL LOW (ref 1.15–1.40)
Chloride: 109 mmol/L (ref 98–111)
Creatinine, Ser: 1.3 mg/dL — ABNORMAL HIGH (ref 0.61–1.24)
Glucose, Bld: 115 mg/dL — ABNORMAL HIGH (ref 70–99)
HCT: 41 % (ref 39.0–52.0)
Hemoglobin: 13.9 g/dL (ref 13.0–17.0)
Potassium: 3.6 mmol/L (ref 3.5–5.1)
Sodium: 142 mmol/L (ref 135–145)
TCO2: 24 mmol/L (ref 22–32)

## 2021-12-11 LAB — CBC
HCT: 40.2 % (ref 39.0–52.0)
Hemoglobin: 12.7 g/dL — ABNORMAL LOW (ref 13.0–17.0)
MCH: 25.4 pg — ABNORMAL LOW (ref 26.0–34.0)
MCHC: 31.6 g/dL (ref 30.0–36.0)
MCV: 80.4 fL (ref 80.0–100.0)
Platelets: 227 10*3/uL (ref 150–400)
RBC: 5 MIL/uL (ref 4.22–5.81)
RDW: 16.4 % — ABNORMAL HIGH (ref 11.5–15.5)
WBC: 5.9 10*3/uL (ref 4.0–10.5)
nRBC: 0 % (ref 0.0–0.2)

## 2021-12-11 LAB — RAPID URINE DRUG SCREEN, HOSP PERFORMED
Amphetamines: NOT DETECTED
Barbiturates: NOT DETECTED
Benzodiazepines: NOT DETECTED
Cocaine: NOT DETECTED
Opiates: NOT DETECTED
Tetrahydrocannabinol: NOT DETECTED

## 2021-12-11 LAB — COMPREHENSIVE METABOLIC PANEL
ALT: 19 U/L (ref 0–44)
AST: 20 U/L (ref 15–41)
Albumin: 3.5 g/dL (ref 3.5–5.0)
Alkaline Phosphatase: 80 U/L (ref 38–126)
Anion gap: 8 (ref 5–15)
BUN: 17 mg/dL (ref 6–20)
CO2: 23 mmol/L (ref 22–32)
Calcium: 8.7 mg/dL — ABNORMAL LOW (ref 8.9–10.3)
Chloride: 109 mmol/L (ref 98–111)
Creatinine, Ser: 1.39 mg/dL — ABNORMAL HIGH (ref 0.61–1.24)
GFR, Estimated: 58 mL/min — ABNORMAL LOW (ref >60)
Glucose, Bld: 116 mg/dL — ABNORMAL HIGH (ref 70–99)
Potassium: 3.7 mmol/L (ref 3.5–5.1)
Sodium: 140 mmol/L (ref 135–145)
Total Bilirubin: 0.6 mg/dL (ref 0.3–1.2)
Total Protein: 7 g/dL (ref 6.5–8.1)

## 2021-12-11 LAB — ETHANOL: Alcohol, Ethyl (B): <10 mg/dL (ref <10)

## 2021-12-11 LAB — RESP PANEL BY RT-PCR (FLU A&B, COVID) ARPGX2
Influenza A by PCR: NEGATIVE
Influenza B by PCR: NEGATIVE
SARS Coronavirus 2 by RT PCR: NEGATIVE

## 2021-12-11 LAB — CBG MONITORING, ED: Glucose-Capillary: 102 mg/dL — ABNORMAL HIGH (ref 70–99)

## 2021-12-11 LAB — APTT: aPTT: 32 seconds (ref 24–36)

## 2021-12-11 LAB — PROTIME-INR
INR: 1 (ref 0.8–1.2)
Prothrombin Time: 13.3 seconds (ref 11.4–15.2)

## 2021-12-11 IMAGING — CT CT HEAD CODE STROKE
3 series · 14 of 47 positions shown, 16 images · non-contrast
Comparison: [DATE].

CLINICAL DATA: Code stroke.



[Series 3: head 5.0 h30s · axial · 0.48mm/px · z∈[-60,+80]mm · 8 of 34 slices shown, 10 images]
[im 3/34  brain]
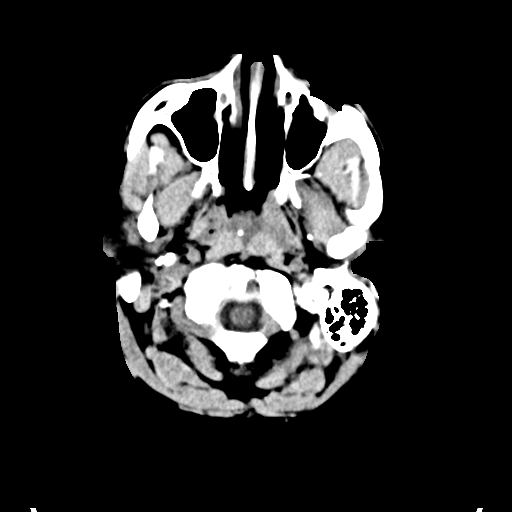
[im 3/34  bone]
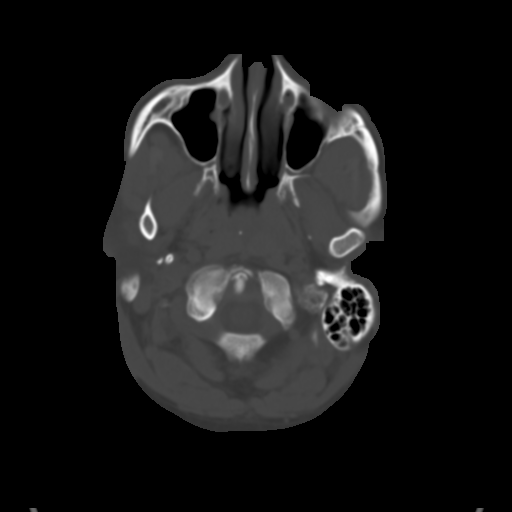
[im 7/34  brain]
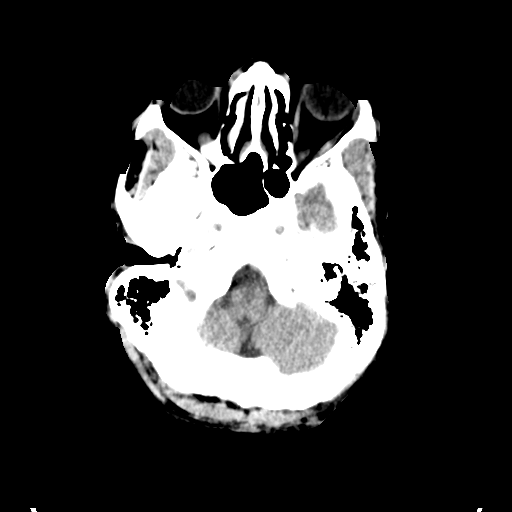
[im 11/34  brain]
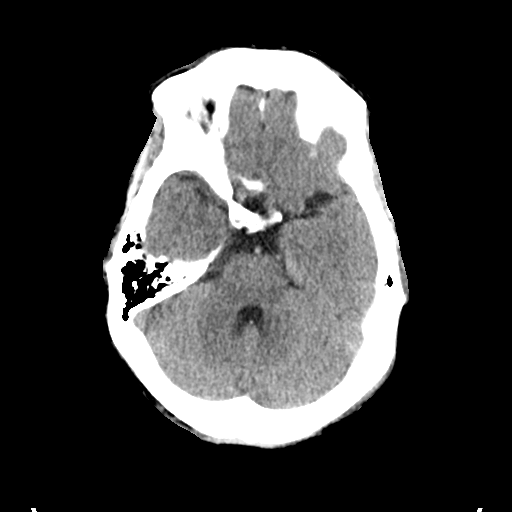
[im 15/34  brain]
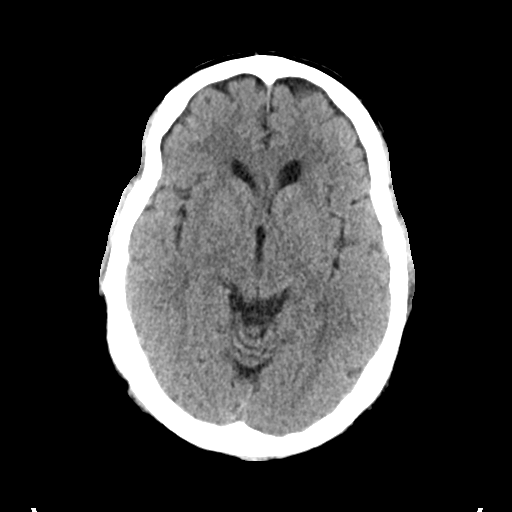
[im 19/34  brain]
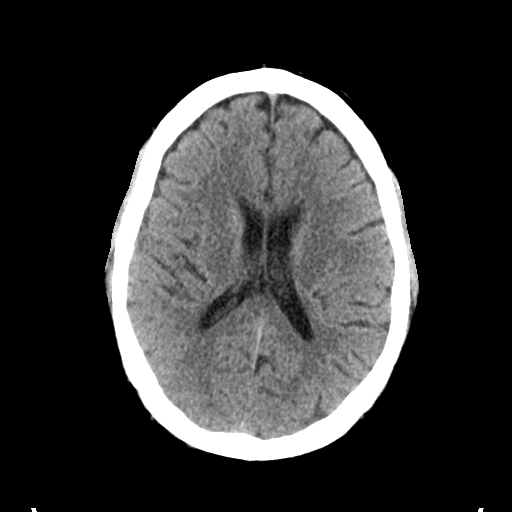
[im 19/34  bone]
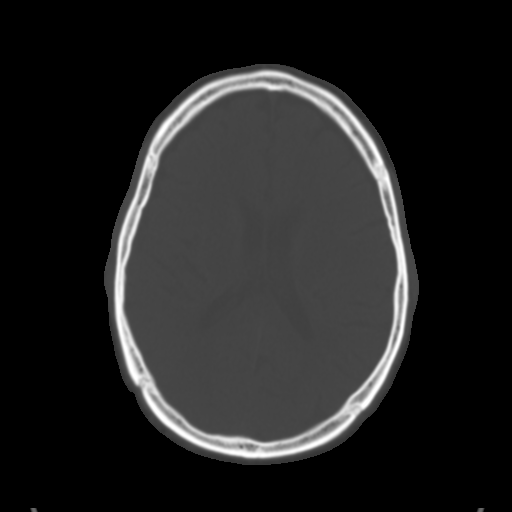
[im 23/34  brain]
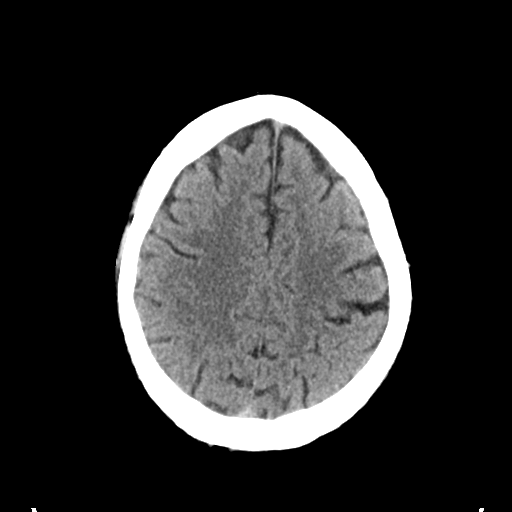
[im 27/34  brain]
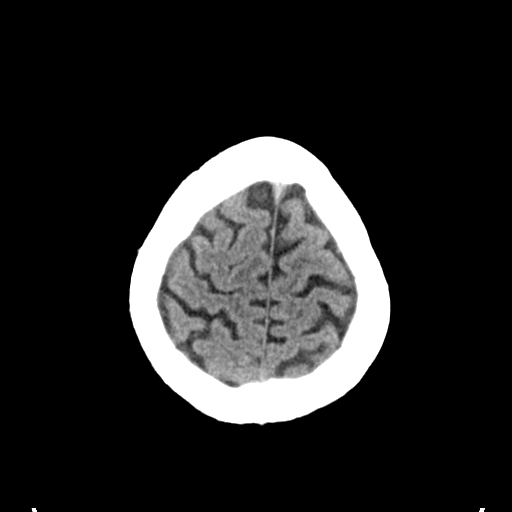
[im 31/34  brain]
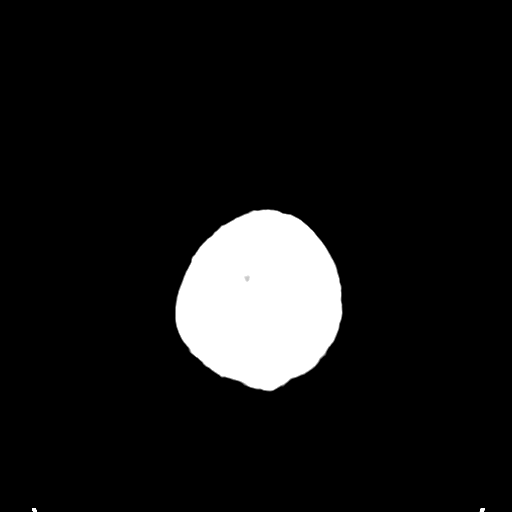

[Series 5: head 3.0 mpr cor · coronal · 0.33mm/px · 3 of 76 slices shown]
[im 26/76  brain]
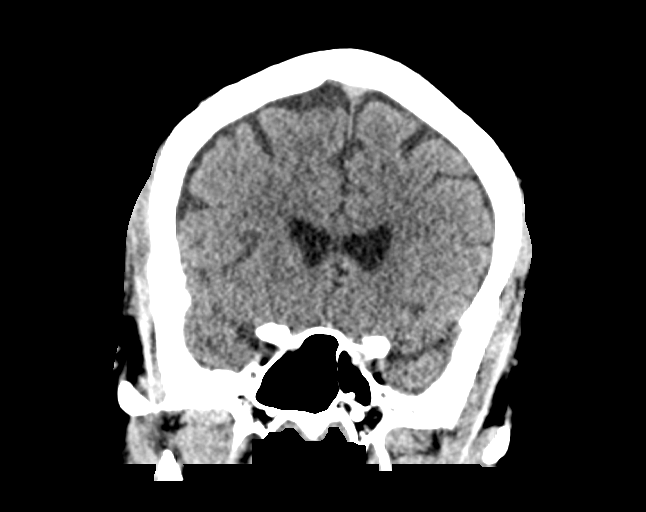
[im 34/76  brain]
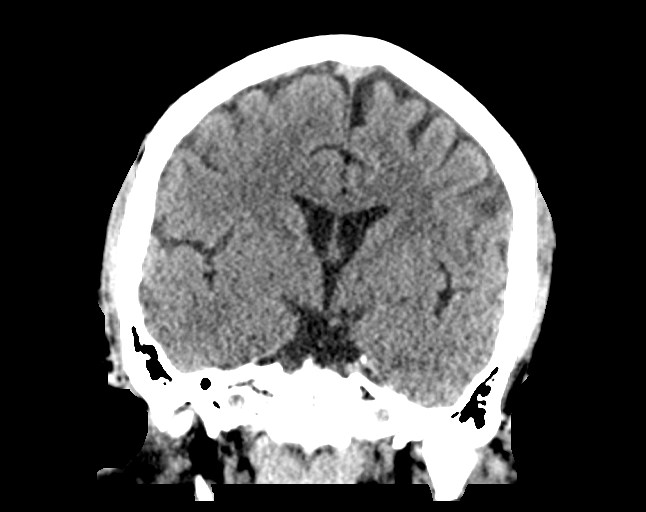
[im 42/76  brain]
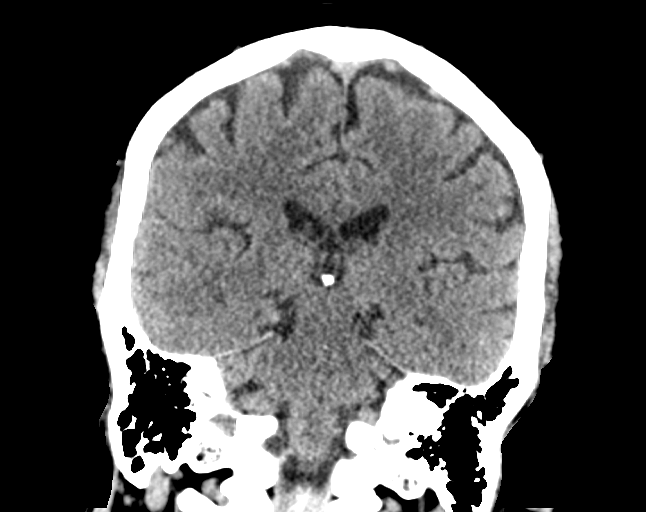

[Series 6: head 3.0 mpr sag · sagittal · 0.33mm/px · 3 of 66 slices shown]
[im 22/66  brain]
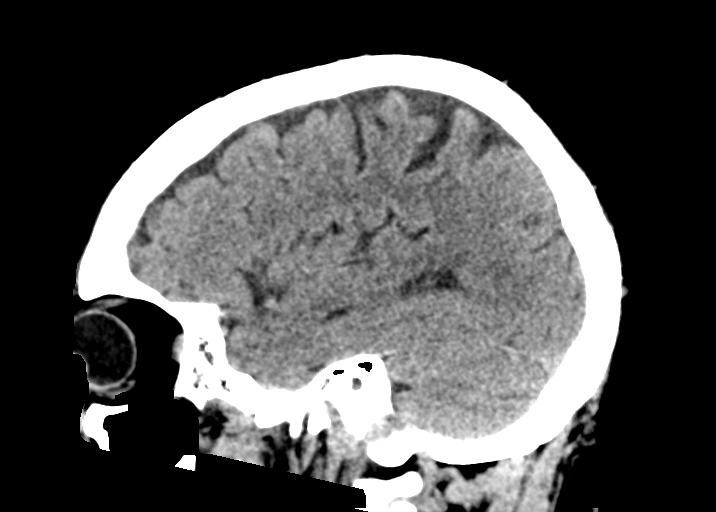
[im 33/66  brain]
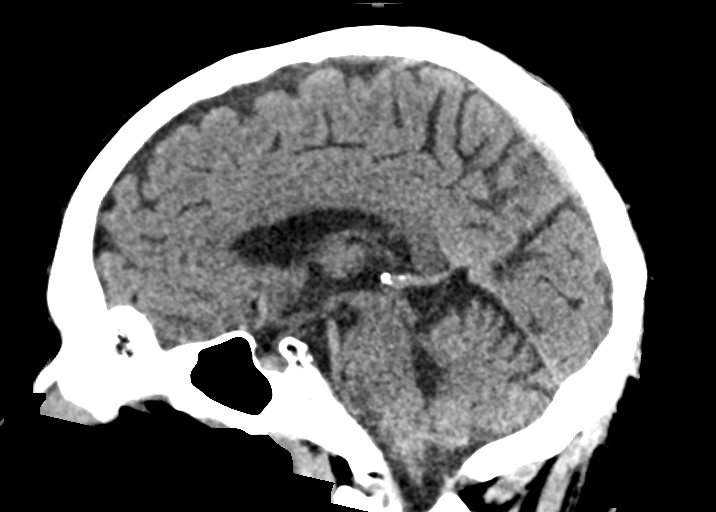
[im 44/66  brain]
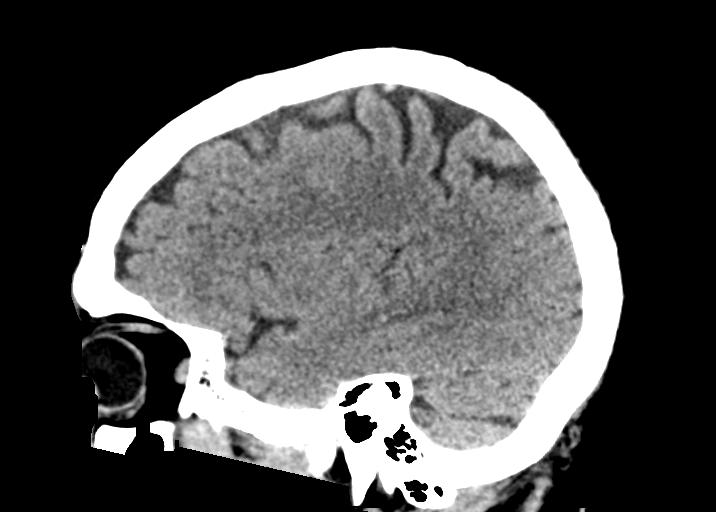

[14 of 47 positions shown; findings below may reference images not displayed]

FINDINGS: Brain: No evidence of acute infarction, hemorrhage, cerebral edema,
mass, mass effect, or midline shift. Ventricles and sulci are normal
for age. No extra-axial fluid collection.

Vascular: No hyperdense vessel or unexpected calcification.

Skull: Normal. Negative for fracture or focal lesion.

Sinuses/Orbits: Mucous retention cysts in the maxillary sinuses.
Hypoplastic frontal sinuses. The orbits are unremarkable.

Other: The mastoid air cells are well aerated.

ASPECTS (Alberta Stroke Program Early CT Score)

- Ganglionic level infarction (caudate, lentiform nuclei, internal
capsule, insula, M1-M3 cortex): 7

- Supraganglionic infarction (M4-M6 cortex): 3

Total score (0-10 with 10 being normal): 10
IMPRESSION: 1. No acute intracranial process.
2. ASPECTS is 10

Code stroke imaging results were communicated on [DATE] at [DATE] to provider Dr. BRIEDENHANN via secure text paging.

## 2021-12-11 IMAGING — MR MR HEAD W/O CM
6 of 10 series · 29 of 48 positions shown · non-contrast
Comparison: None.

CLINICAL DATA: Acute neurologic deficit

EXAM:
MRI HEAD WITHOUT CONTRAST
TECHNIQUE: Multiplanar, multiecho pulse sequences of the brain and surrounding
structures were obtained without intravenous contrast.

[Series 2: DWI · axial · 3.0mm · 0.94mm/px · z∈[-88,+66]mm · 9 of 106 slices shown (1 of 2)]
[im 1/106]
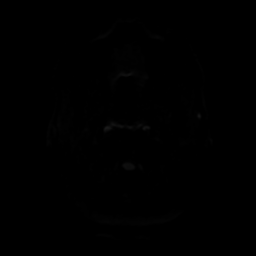
[im 14/106]
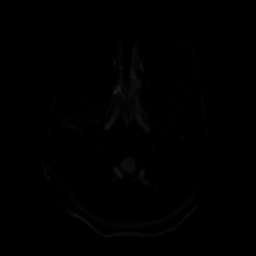
[im 27/106]
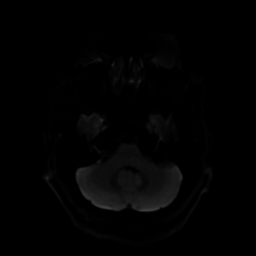
[im 40/106]
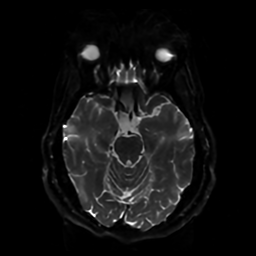
[im 53/106]
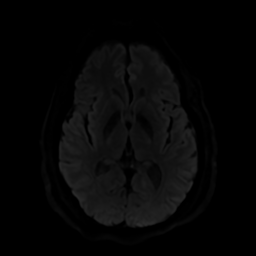
[im 66/106]
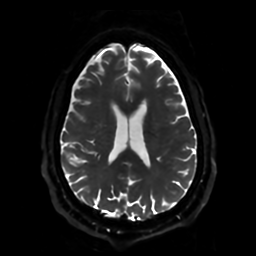
[im 79/106]
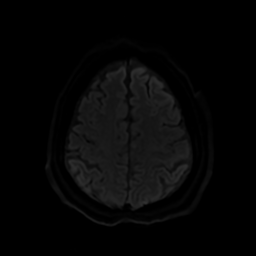
[im 92/106]
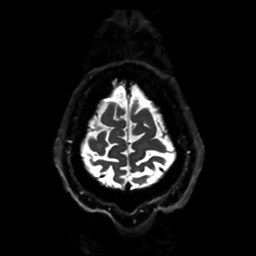
[im 106/106]
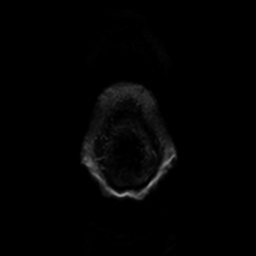

[Series 3: DWI · coronal · 4.0mm · 0.94mm/px · 7 of 76 slices shown (2 of 2)]
[im 1/76]
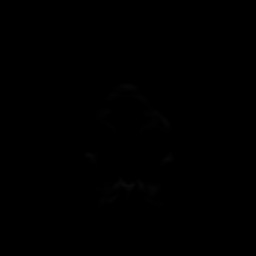
[im 13/76]
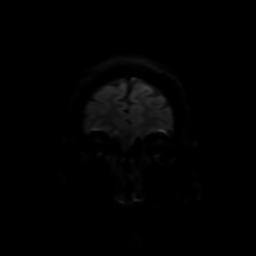
[im 26/76]
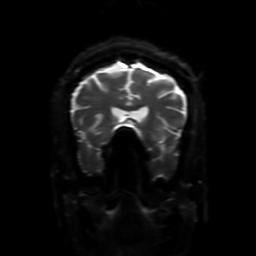
[im 38/76]
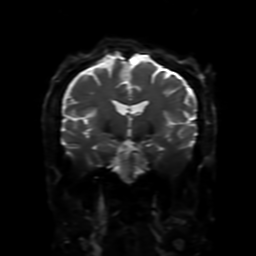
[im 51/76]
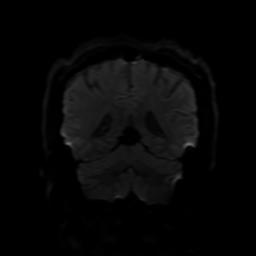
[im 63/76]
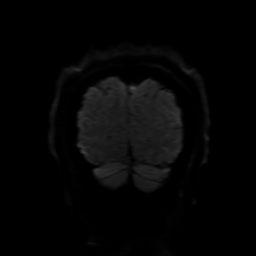
[im 76/76]
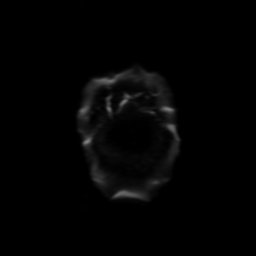

[Series 4: FLAIR · sagittal · 5.0mm · 0.23mm/px · 2 of 25 slices shown (1 of 2)]
[im 1/25]
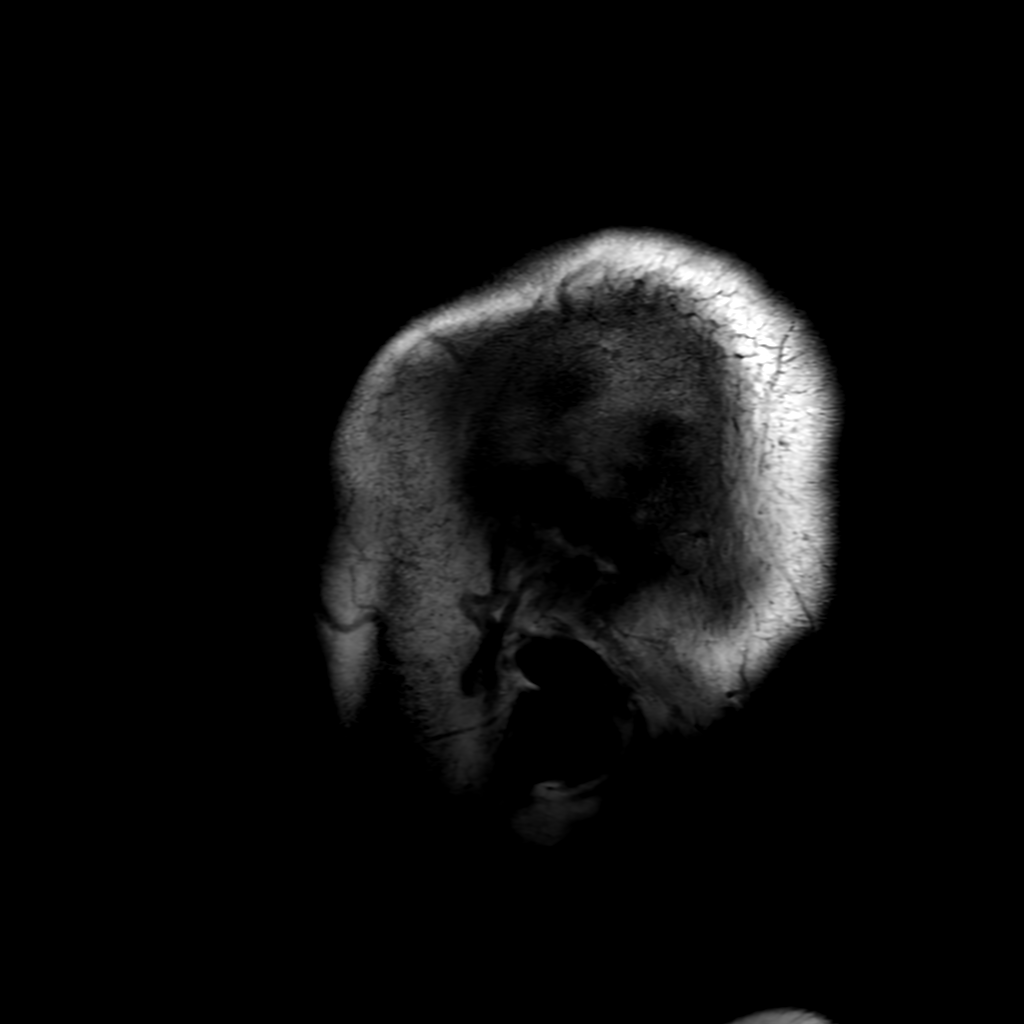
[im 25/25]
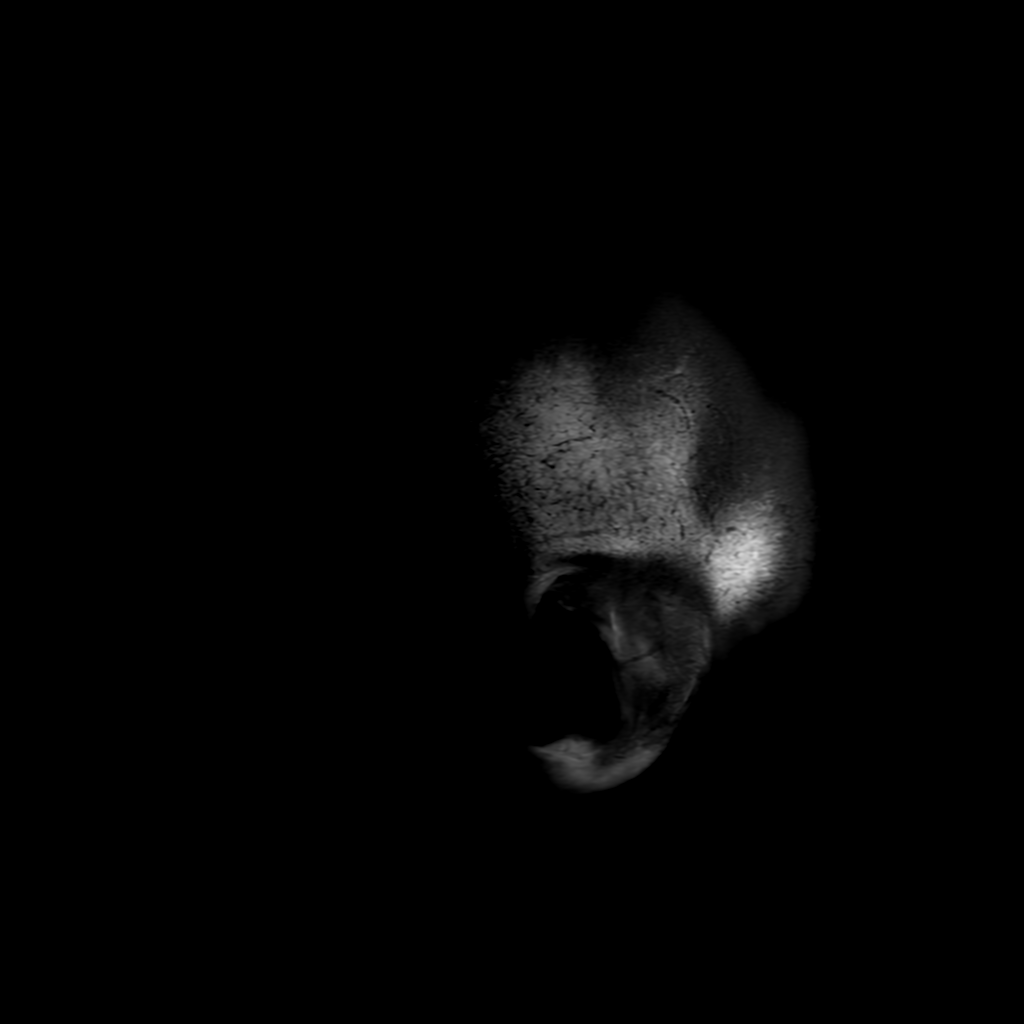

[Series 6: FLAIR · axial · 4.0mm · 0.45mm/px · z∈[-84,+63]mm · 3 of 36 slices shown (2 of 2)]
[im 1/36]
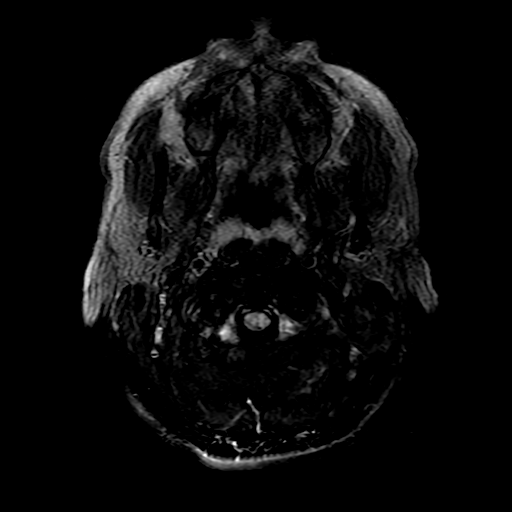
[im 18/36]
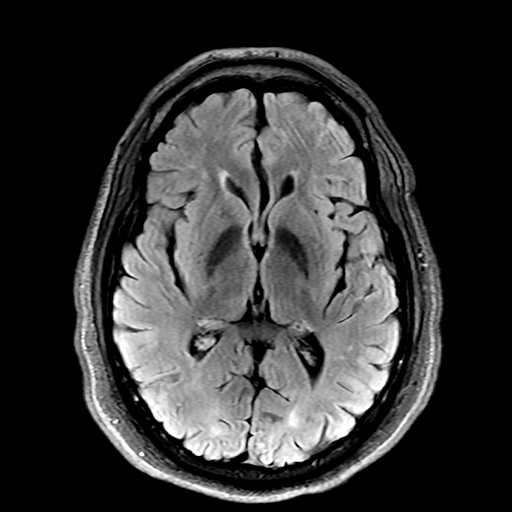
[im 36/36]
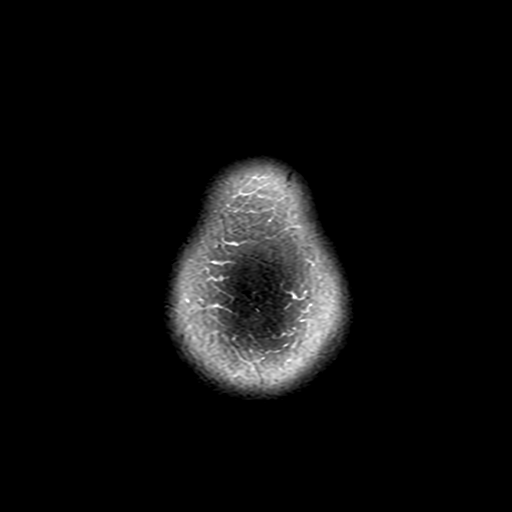

[Series 250: ADC · axial · 3.0mm · 0.94mm/px · z∈[-88,+66]mm · 5 of 54 slices shown (1 of 2)]
[im 1/54]
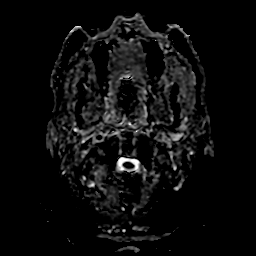
[im 14/54]
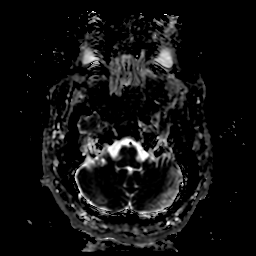
[im 27/54]
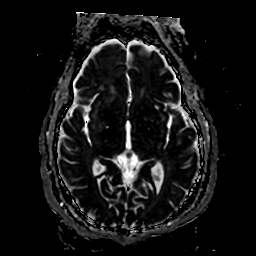
[im 40/54]
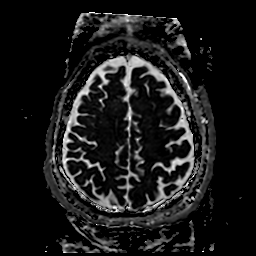
[im 54/54]
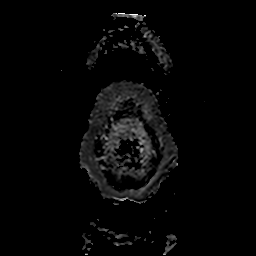

[Series 350: ADC · coronal · 4.0mm · 0.94mm/px · 3 of 38 slices shown (2 of 2)]
[im 1/38]
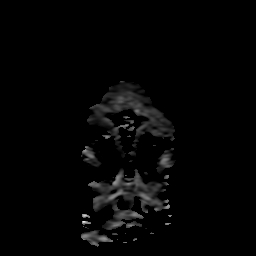
[im 19/38]
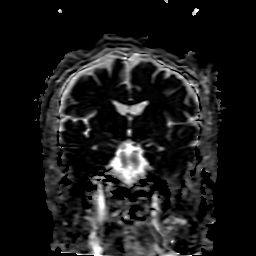
[im 38/38]
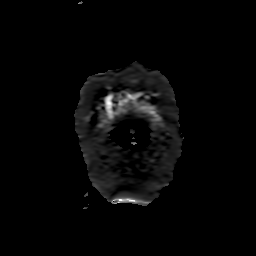

[29 of 48 positions shown; findings below may reference images not displayed]

FINDINGS: Brain: No acute infarct, mass effect or extra-axial collection. No
acute or chronic hemorrhage. There is multifocal hyperintense
T2-weighted signal within the white matter. Parenchymal volume and
CSF spaces are normal. The midline structures are normal.

Vascular: Major flow voids are preserved.

Skull and upper cervical spine: Normal calvarium and skull base.
Visualized upper cervical spine and soft tissues are normal.

Sinuses/Orbits:No paranasal sinus fluid levels or advanced mucosal
thickening. No mastoid or middle ear effusion. Normal orbits.
IMPRESSION: 1. No acute intracranial abnormality.
2. Findings of chronic microvascular ischemia.

## 2021-12-11 IMAGING — CT CT ANGIO HEAD-NECK (W OR W/O PERF)
1 of 8 series · 14 of 47 positions shown · non-contrast
Comparison: None.

CLINICAL DATA: Acute neurologic deficit

EXAM:
CT ANGIOGRAPHY HEAD AND NECK
TECHNIQUE: Multidetector CT imaging of the head and neck was performed using
the standard protocol during bolus administration of intravenous
contrast. Multiplanar CT image reconstructions and MIPs were
obtained to evaluate the vascular anatomy. Carotid stenosis
measurements (when applicable) are obtained utilizing NASCET
criteria, using the distal internal carotid diameter as the
denominator.

[Series 6: thin axials · axial · 0.46mm/px · z∈[-250,+54]mm · 14 of 703 slices shown]
[im 47/703  brain]
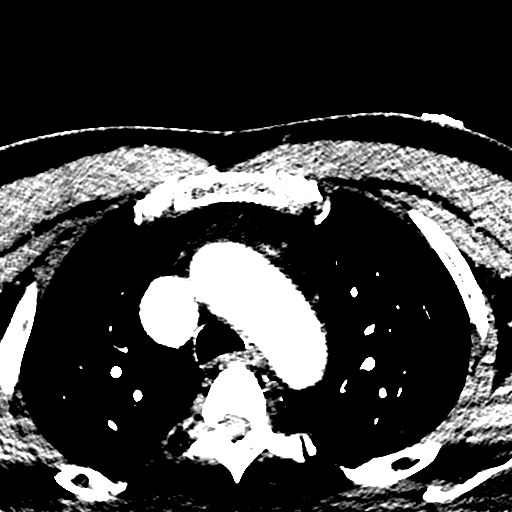
[im 94/703  bone]
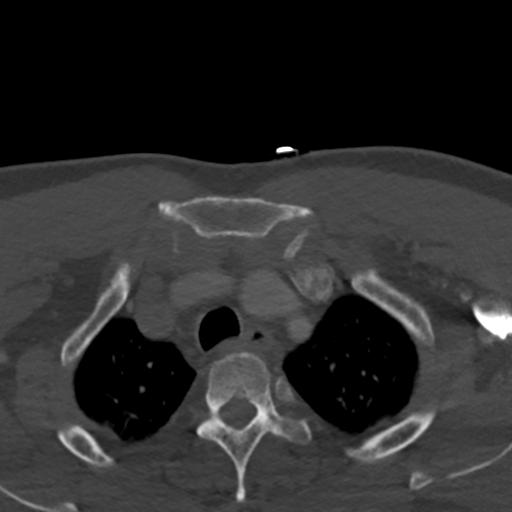
[im 141/703  brain]
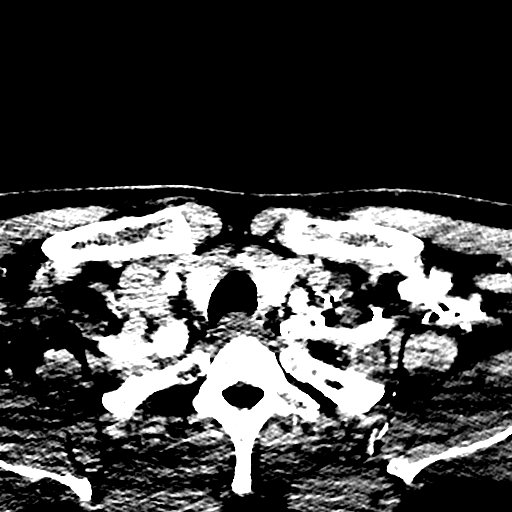
[im 188/703  bone]
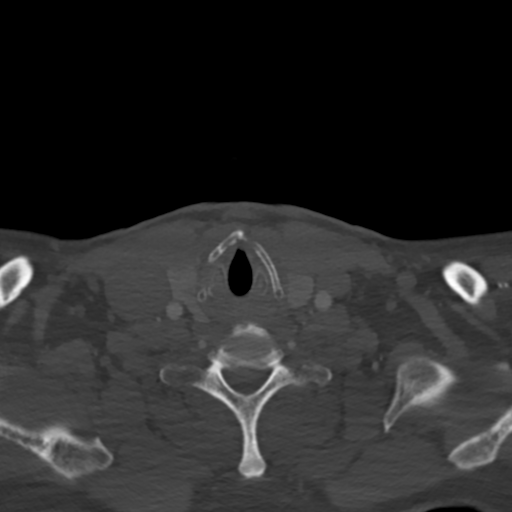
[im 235/703  brain]
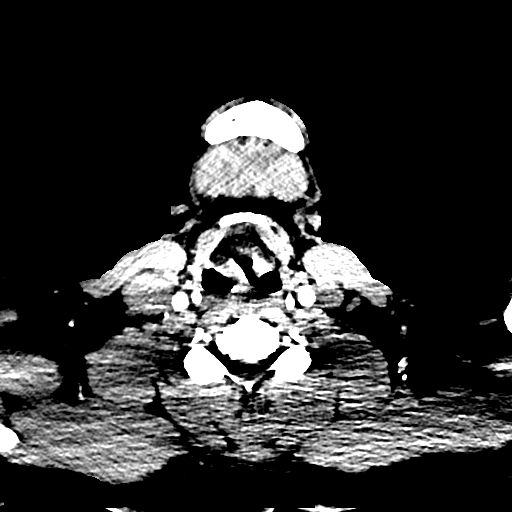
[im 281/703  bone]
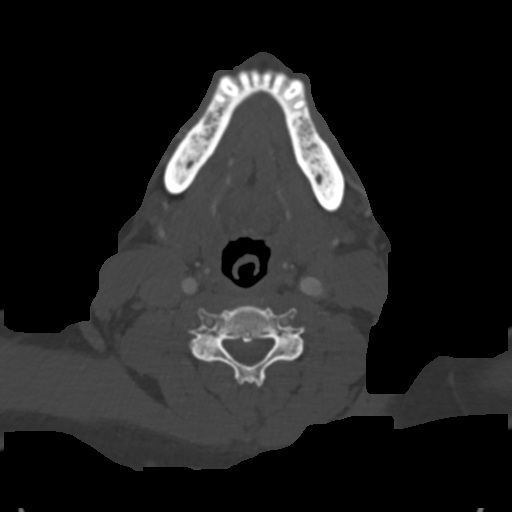
[im 328/703  brain]
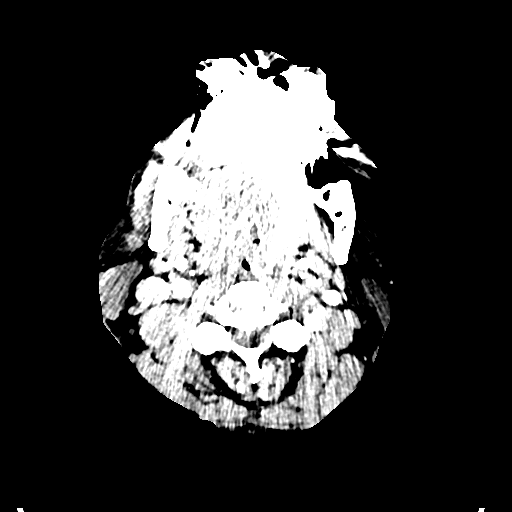
[im 375/703  bone]
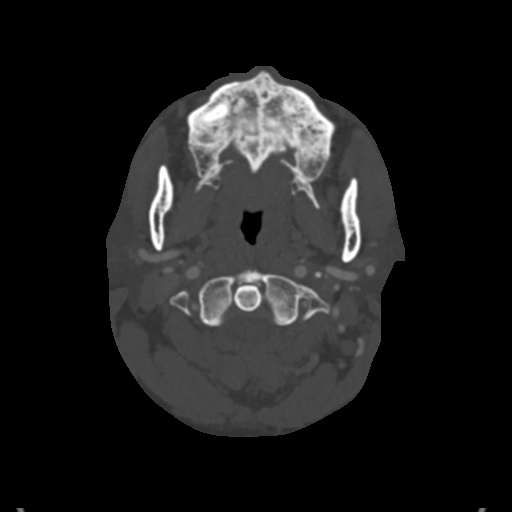
[im 422/703  brain]
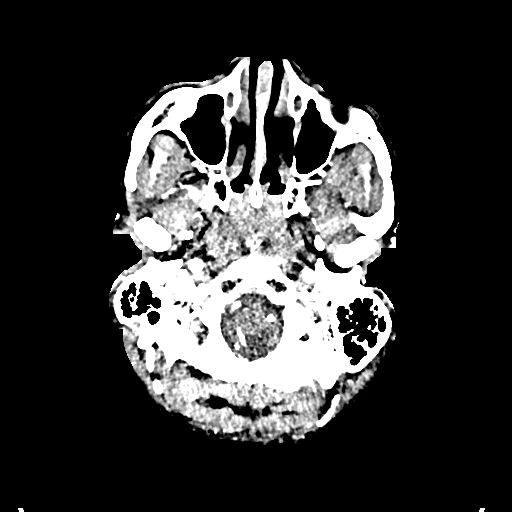
[im 469/703  bone]
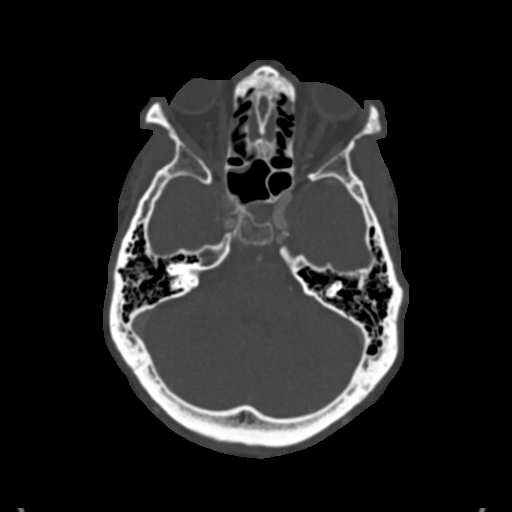
[im 515/703  brain]
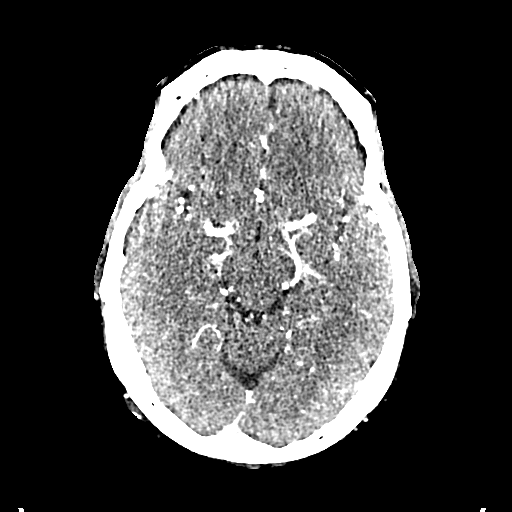
[im 562/703  bone]
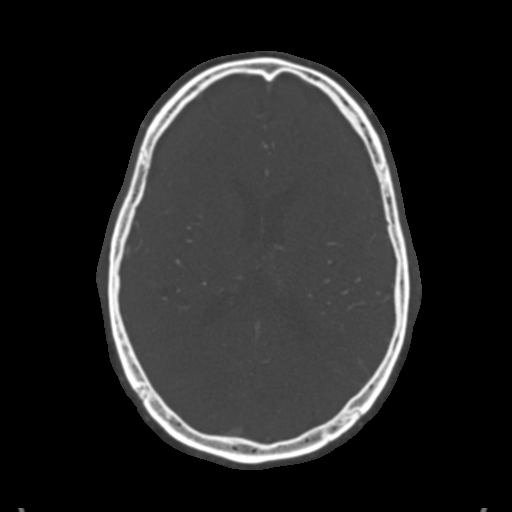
[im 609/703  brain]
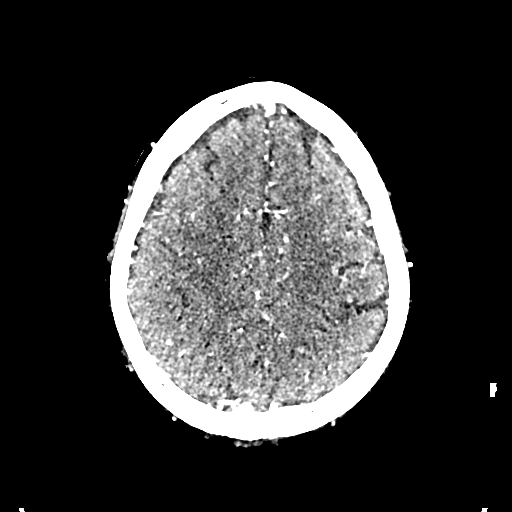
[im 656/703  bone]
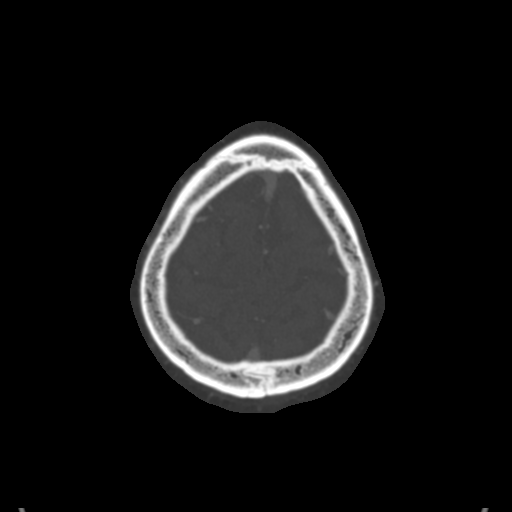

[14 of 47 positions shown; findings below may reference images not displayed]

RADIATION DOSE REDUCTION: This exam was performed according to the
departmental dose-optimization program which includes automated
exposure control, adjustment of the mA and/or kV according to
patient size and/or use of iterative reconstruction technique.

CONTRAST:  100mL OMNIPAQUE IOHEXOL 350 MG/ML SOLN
FINDINGS: CTA NECK FINDINGS

SKELETON: There is no bony spinal canal stenosis. No lytic or
blastic lesion.

OTHER NECK: Normal pharynx, larynx and major salivary glands. No
cervical lymphadenopathy. Unremarkable thyroid gland.

UPPER CHEST: No pneumothorax or pleural effusion. No nodules or
masses.

AORTIC ARCH:

There is no calcific atherosclerosis of the aortic arch. There is no
aneurysm, dissection or hemodynamically significant stenosis of the
visualized portion of the aorta. Normal variant aortic arch
branching pattern with the brachiocephalic and left common carotid
arteries sharing a common origin. The visualized proximal subclavian
arteries are widely patent.

RIGHT CAROTID SYSTEM: Normal without aneurysm, dissection or
stenosis.

LEFT CAROTID SYSTEM: Normal without aneurysm, dissection or
stenosis.

VERTEBRAL ARTERIES: Left dominant configuration. Both origins are
clearly patent. There is no dissection, occlusion or flow-limiting
stenosis to the skull base (V1-V3 segments).

CTA HEAD FINDINGS

POSTERIOR CIRCULATION:

--Vertebral arteries: Normal V4 segments.

--Inferior cerebellar arteries: Normal.

--Basilar artery: Normal.

--Superior cerebellar arteries: Normal.

--Posterior cerebral arteries (PCA): Normal.

ANTERIOR CIRCULATION:

--Intracranial internal carotid arteries: Normal.

--Anterior cerebral arteries (ACA): Normal. Both A1 segments are
present. Patent anterior communicating artery (a-comm).

--Middle cerebral arteries (MCA): Normal.

VENOUS SINUSES: As permitted by contrast timing, patent.

ANATOMIC VARIANTS: Fetal origin of the left posterior cerebral
artery.

Review of the MIP images confirms the above findings.
IMPRESSION: Normal CTA of the head and neck.

## 2021-12-11 MED ORDER — IOHEXOL 350 MG/ML SOLN
100.0000 mL | Freq: Once | INTRAVENOUS | Status: AC | PRN
  Administered 2021-12-11: 100 mL via INTRAVENOUS

## 2021-12-11 MED ORDER — ACETAMINOPHEN 500 MG PO TABS
1000.0000 mg | ORAL_TABLET | Freq: Once | ORAL | Status: AC
  Administered 2021-12-11: 1000 mg via ORAL
  Filled 2021-12-11: qty 2

## 2021-12-11 MED ORDER — PROCHLORPERAZINE EDISYLATE 10 MG/2ML IJ SOLN
10.0000 mg | Freq: Once | INTRAMUSCULAR | Status: AC
  Administered 2021-12-12: 10 mg via INTRAVENOUS
  Filled 2021-12-11: qty 2

## 2021-12-11 MED ORDER — KETOROLAC TROMETHAMINE 15 MG/ML IJ SOLN: 15.0000 mg | Freq: Once | INTRAMUSCULAR | Status: DC

## 2021-12-11 MED ORDER — METOCLOPRAMIDE HCL 5 MG/ML IJ SOLN
10.0000 mg | Freq: Once | INTRAMUSCULAR | Status: AC
  Administered 2021-12-11: 10 mg via INTRAVENOUS
  Filled 2021-12-11: qty 2

## 2021-12-11 MED ORDER — LACTATED RINGERS IV BOLUS
1000.0000 mL | Freq: Once | INTRAVENOUS | Status: AC
  Administered 2021-12-11: 1000 mL via INTRAVENOUS

## 2021-12-11 MED ORDER — DIPHENHYDRAMINE HCL 50 MG/ML IJ SOLN
12.5000 mg | Freq: Once | INTRAMUSCULAR | Status: AC
  Administered 2021-12-11: 12.5 mg via INTRAVENOUS
  Filled 2021-12-11: qty 1

## 2021-12-11 NOTE — ED Provider Notes (Signed)
?Corey Patterson ?Provider Note ? ? ?CSN: [id number] ?Arrival date & time: 12/11/21  1922 ? ?An emergency department physician performed an initial assessment on this suspected stroke patient at 62. ? ?History ? ?Chief Complaint  ?Patient presents with  ? Code Stroke  ? ? ?Corey Patterson is a 61 y.o. male. ? ?HPI ?61 year old male presenting to the emergency department with a headache, right-sided deficits.  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  He has had progressive neck pain with radiation of pain down his shoulder over the past few days.  He presents to the emergency department as a code stroke from urgent care due to new onset right arm weakness that began today with associated right lower extremity weakness. ? ?The patient went to urgent care after his shift to be evaluated for pain in the right neck and shoulder and he was notably with a right-sided weakness, right facial droop with some slurred speech with a last known normal of 10 AM.  He denies any fevers or chills.  He continues to endorse pain in his neck.  He endorses a headache. ? ?Home Medications ?Prior to Admission medications   ?Medication Sig Start Date End Date Taking? Authorizing Provider  ?acetaminophen (TYLENOL) 500 MG tablet Take 1,000 mg by mouth every 6 (six) hours as needed for moderate pain or headache.   Yes [provider]  ?amLODipine (NORVASC) 10 MG tablet Take 10 mg by mouth daily. 09/26/21  Yes [provider]  ?aspirin EC 81 MG tablet Take 81 mg by mouth daily. Swallow whole.   Yes [provider]  ?ferrous sulfate 325 (65 FE) MG tablet Take 325 mg by mouth daily with breakfast.   Yes [provider]  ?Multiple Vitamins-Minerals (ONE-A-DAY MENS 50+) TABS Take 1 tablet by mouth daily.   Yes [provider]  ?   ? ?Allergies    ?Patient has no known allergies.   ? ?Review of Systems   ?Review of Systems  ?Unable to perform ROS:  Acuity of condition  ? ?Physical Exam ?Updated Vital Signs ?BP 128/83   Pulse 61   Temp 97.8 ?F (36.6 ?C) (Oral)   Resp 12   Wt 102 kg   SpO2 94%   BMI 30.50 kg/m?  ?Physical Exam ?Vitals and nursing note reviewed.  ?Constitutional:   ?   General: He is not in acute distress. ?   Appearance: He is well-developed.  ?HENT:  ?   Head: Normocephalic and atraumatic.  ?Eyes:  ?   Conjunctiva/sclera: Conjunctivae normal.  ?Neck:  ?   Comments: Tenderness to palpation of the soft tissues of the neck. No clear Spurling sign. ?Cardiovascular:  ?   Rate and Rhythm: Normal rate and regular rhythm.  ?Pulmonary:  ?   Effort: Pulmonary effort is normal. No respiratory distress.  ?   Breath sounds: Normal breath sounds.  ?Abdominal:  ?   Palpations: Abdomen is soft.  ?   Tenderness: There is no abdominal tenderness.  ?Musculoskeletal:     ?   General: No swelling.  ?   Cervical back: Neck supple. Tenderness present. No rigidity.  ?   Comments: Negative straight leg raise test bilaterally  ?Skin: ?   General: Skin is warm and dry.  ?   Capillary Refill: Capillary refill takes less than 2 seconds.  ?Neurological:  ?   Mental Status: He is alert.  ?   Comments: MENTAL  STATUS EXAM:    ?Orientation: Alert and oriented to person, place and time.  ?Memory: Cooperative, follows commands well.  ?Language: Speech is clear and language is normal.  ? ?CRANIAL NERVES:    ?CN 2 (Optic): Visual fields intact to confrontation.  ?CN 3,4,6 (EOM): Pupils equal and reactive to light. Full extraocular eye movement without nystagmus.  ?CN 5 (Trigeminal): Facial sensation is normal, no weakness of masticatory muscles.  ?CN 7 (Facial): No facial weakness or asymmetry.  ?CN 8 (Auditory): Auditory acuity grossly normal.  ?CN 9,10 (Glossophar): The uvula is midline, the palate elevates symmetrically.  ?CN 11 (spinal access): Normal sternocleidomastoid and trapezius strength.  ?CN 12 (Hypoglossal): The tongue is midline. No atrophy or fasciculations..   ? ?MOTOR:  Muscle Strength: 4/5RUE, 5/5LUE, 4/5RLE, 5/5LLE.  ? ?COORDINATION:   Intact finger-to-nose, no tremor.  ? ?SENSATION:   Intact to light touch all four extremities. ? ?  ?Psychiatric:     ?   Mood and Affect: Mood normal.  ? ? ?ED Results / Procedures / Treatments   ?Labs ?(all labs ordered are listed, but only abnormal results are displayed) ?Labs Reviewed  ?CBC - Abnormal; Notable for the following components:  ?    Result Value  ? Hemoglobin 12.7 (*)   ? MCH 25.4 (*)   ? RDW 16.4 (*)   ? All other components within normal limits  ?COMPREHENSIVE METABOLIC PANEL - Abnormal; Notable for the following components:  ? Glucose, Bld 116 (*)   ? Creatinine, Ser 1.39 (*)   ? Calcium 8.7 (*)   ? GFR, Estimated 58 (*)   ? All other components within normal limits  ?I-STAT CHEM 8, ED - Abnormal; Notable for the following components:  ? Creatinine, Ser 1.30 (*)   ? Glucose, Bld 115 (*)   ? Calcium, Ion 1.09 (*)   ? All other components within normal limits  ?CBG MONITORING, ED - Abnormal; Notable for the following components:  ? Glucose-Capillary 102 (*)   ? All other components within normal limits  ?RESP PANEL BY RT-PCR (FLU A&B, COVID) ARPGX2  ?ETHANOL  ?PROTIME-INR  ?APTT  ?DIFFERENTIAL  ?RAPID URINE DRUG SCREEN, HOSP PERFORMED  ?URINALYSIS, ROUTINE W REFLEX MICROSCOPIC  ?SEDIMENTATION RATE  ?C-REACTIVE PROTEIN  ? ? ?EKG ?EKG Interpretation ? ?Date/Time:  Tuesday December 11 2021 20:02:48 EDT ?Ventricular Rate:  65 ?PR Interval:  224 ?QRS Duration: 102 ?QT Interval:  431 ?QTC Calculation: 449 ?R Axis:   64 ?Text Interpretation: Sinus rhythm Prolonged PR interval Confirmed by Regan Lemming ([id number]) on 12/11/2021 8:07:53 PM ? ?Radiology ?MR BRAIN WO CONTRAST ? ?Result Date: 12/11/2021 ?CLINICAL DATA:  Acute neurologic deficit EXAM: MRI HEAD WITHOUT CONTRAST TECHNIQUE: Multiplanar, multiecho pulse sequences of the brain and surrounding structures were obtained without intravenous contrast. COMPARISON:  None. FINDINGS:  Brain: No acute infarct, mass effect or extra-axial collection. No acute or chronic hemorrhage. There is multifocal hyperintense T2-weighted signal within the Helbig matter. Parenchymal volume and CSF spaces are normal. The midline structures are normal. Vascular: Major flow voids are preserved. Skull and upper cervical spine: Normal calvarium and skull base. Visualized upper cervical spine and soft tissues are normal. Sinuses/Orbits:No paranasal sinus fluid levels or advanced mucosal thickening. No mastoid or middle ear effusion. Normal orbits. IMPRESSION: 1. No acute intracranial abnormality. 2. Findings of chronic microvascular ischemia. Electronically Signed   By: Ulyses Jarred M.D.   On: 12/11/2021 23:43  ? ?CT HEAD CODE STROKE WO CONTRAST ? ?Result Date: 12/11/2021 ?CLINICAL  DATA:  Code stroke. EXAM: CT HEAD WITHOUT CONTRAST TECHNIQUE: Contiguous axial images were obtained from the base of the skull through the vertex without intravenous contrast. RADIATION DOSE REDUCTION: This exam was performed according to the departmental dose-optimization program which includes automated exposure control, adjustment of the mA and/or kV according to patient size and/or use of iterative reconstruction technique. COMPARISON:  06/20/2014. FINDINGS: Brain: No evidence of acute infarction, hemorrhage, cerebral edema, mass, mass effect, or midline shift. Ventricles and sulci are normal for age. No extra-axial fluid collection. Vascular: No hyperdense vessel or unexpected calcification. Skull: Normal. Negative for fracture or focal lesion. Sinuses/Orbits: Mucous retention cysts in the maxillary sinuses. Hypoplastic frontal sinuses. The orbits are unremarkable. Other: The mastoid air cells are well aerated. ASPECTS [facility] Stroke Program Early CT Score) - Ganglionic level infarction (caudate, lentiform nuclei, internal capsule, insula, M1-M3 cortex): 7 - Supraganglionic infarction (M4-M6 cortex): 3 Total score (0-10 with 10 being  normal): 10 IMPRESSION: 1. No acute intracranial process. 2. ASPECTS is 10 Code stroke imaging results were communicated on 12/11/2021 at 7:41 pm to provider Dr. Leonel Ramsay via secure text paging. Electronically

## 2021-12-11 NOTE — ED Notes (Signed)
Per MD Amada Jupiter pt's NIHSS is a 4. See RN Modified NIHSS assessment ?

## 2021-12-11 NOTE — ED Notes (Signed)
Pt returned to room, pt being placed back on monitor by NT ?

## 2021-12-11 NOTE — Code Documentation (Signed)
Stroke Response Nurse Documentation ?Code Documentation ? ?Corey Patterson is a 61 y.o. male arriving to [facility]  via [facility] on 4/18 with no past medical hx.  On No antithrombotic. Code stroke was activated by EMS.  ? ?Patient from home where he was LKW at 1000 and now complaining of right sided weakness and slurred speech.  ? ?Stroke team at the bedside on patient arrival. Labs drawn and patient cleared for CT by Dr. Karene Fry. Patient to CT with team. NIHSS 4, see documentation for details and code stroke times. Patient with right arm weakness, right leg weakness, right decreased sensation, and dysarthria  on exam. The following imaging was completed:  CT Head, CTA, CTP, and MRI. Patient is not a candidate for IV Thrombolytic due to out of window. Patient is not not a candidate for IR due to No LVO.  ? ? ?Bedside handoff with ED RN Corey Patterson.   ? ?Rose Fillers  ?Rapid Response RN ?  ?

## 2021-12-11 NOTE — ED Triage Notes (Signed)
Pt bib ems as code stroke. Pt had syncopal episode at work this morning and went to urgent care after shift to be evaluated for pain in R shoulder. Urgent care staff noticed him to have R sided weakness, R sided facial droop and slurred speech. LKW 1000.  ? ? ?

## 2021-12-11 NOTE — Consult Note (Signed)
Neurology Consultation ?Reason for Consult: right sided numbness ?Referring Physician: Maury Dus ? ?CC: right sided weakness ? ?History is obtained from: patient ? ?HPI: Corey Patterson is a 61 y.o. male with a history of hypertension who began noticing right sided weakness at 10 am.  Also consisting of pain.  It affects the right side of his face, right arm, and right leg.  He also describes weakness and trouble processing things.  He has never had anything like this happen before. ? ?In addition, he has a retro-orbital headache on the right.  He denies any history of migraines. ? ? ?LKW: 10am ?tpa given?: no, out of window ? ? ? ?ROS: A 14 point ROS was performed and is negative except as noted in the HPI.  ? ?PMH:  ?htn ? ? ? ?Social History:  reports that he has never smoked. He has never used smokeless tobacco. He reports that he does not drink alcohol and does not use drugs. ? ? ?Exam: ?Current vital signs: ?Wt 102 kg   BMI 30.50 kg/m?  ?Vital signs in last 24 hours: ?Weight:  [102 kg] 102 kg (04/18 1900) ? ? ?Physical Exam  ?Constitutional: Appears well-developed and well-nourished.  ?Psych: Affect appropriate to situation ?Eyes: No scleral injection ?HENT: No OP obstruction ?MSK: no joint deformities.  ?Cardiovascular: Normal rate and regular rhythm.  ?Respiratory: Effort normal, non-labored breathing ?GI: Soft.  No distension. There is no tenderness.  ?Skin: WDI ? ?Neuro: ?Mental Status: ?Patient is awake, alert, oriented to person, place, year, and situation. Gives month as march.  ?Patient is able to give a clear and coherent history. ?No signs of neglect. He has slowed speech without definite aphasia.  ?Cranial Nerves: ?II: Visual Fields are full. Pupils are equal, round, and reactive to light.   ?III,IV, VI: EOMI without ptosis or diploplia.  ?V: Facial sensation is dimininshed on the right, slplits the midline.  ?VII: Facial movement is symmetric.  ?VIII: hearing is intact to voice ?X: Uvula elevates  symmetrically ?XI: Shoulder shrug is symmetric. ?XII: tongue is midline without atrophy or fasciculations.  ?Motor: ?Tone is normal. Bulk is normal. 5/5 strength was present on the left, he has 4/5 weakness of the right arm and leg.  This is inconsistent, for instance when holding up for drift he drifts considerably, but when checking finger-nose-finger his arm gets locked in position and does not move. ?Sensory: ?Sensation is symmetric to light touch and temperature in the arms and legs. ?Cerebellar: ?FNF slow but intact bilaterally ? ? ? ? ? ?I have reviewed labs in epic and the results pertinent to this consultation are: ?Cbg 102 ? ? ?I have reviewed the images obtained: ?CT head - negative.  ? ?Impression: 61 yo M with right sided weakness, pain, and headache.  He has multiple findings on his exam concerning for nonphysiologic etiology.  I wonder about possible complicated migraine with embellishment and therefore I do think it be reasonable to treat his headache as migraine.  I do think it would be reasonable given his history of hypertension and lack of history of headaches to do an MRI, but if this is negative no further work-up is needed. ? ?Recommendations: ?1) MRI brain ?2) if negative, no further work-up needed at this time. ? ? ?Roland Rack, MD ?[facility] ?[phone] ? ?If 7pm- 7am, please page neurology on call as listed in [place]. ? ?

## 2021-12-11 NOTE — ED Notes (Signed)
Pt taken to MRI  

## 2021-12-12 ENCOUNTER — Emergency Department (HOSPITAL_COMMUNITY): Payer: BC Managed Care – PPO

## 2021-12-12 LAB — SEDIMENTATION RATE: Sed Rate: 8 mm/hr (ref 0–16)

## 2021-12-12 LAB — C-REACTIVE PROTEIN: CRP: 0.7 mg/dL (ref <1.0)

## 2021-12-12 IMAGING — MR MR CERVICAL SPINE W/O CM
6 series · 32 of 48 positions shown · non-contrast
Comparison: None.

CLINICAL DATA: Acute myelopathy

EXAM:
MRI CERVICAL SPINE WITHOUT CONTRAST
TECHNIQUE: Multiplanar, multisequence MR imaging of the cervical spine was
performed. No intravenous contrast was administered.

[Series 5: T2 · sagittal · 3.0mm · 0.69mm/px · 4 of 18 slices shown (1 of 2)]
[im 1/18]
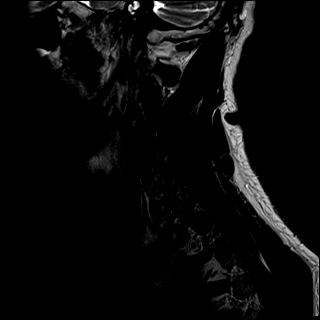
[im 6/18]
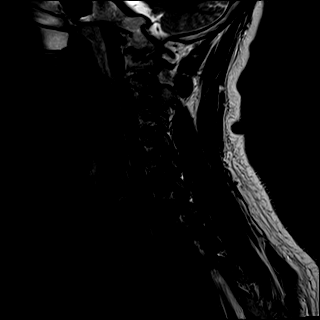
[im 12/18]
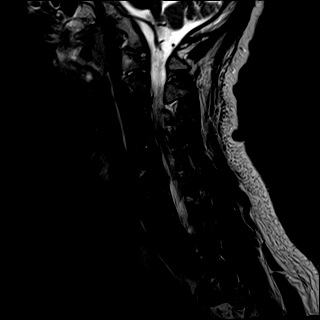
[im 18/18]
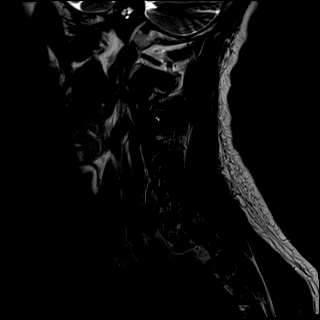

[Series 6: T1 · sagittal · 3.0mm · 0.69mm/px · 4 of 18 slices shown]
[im 1/18]
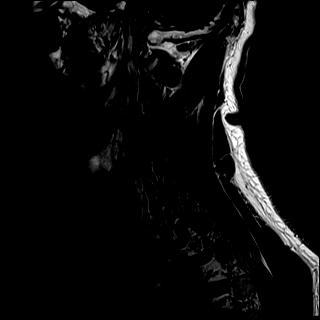
[im 6/18]
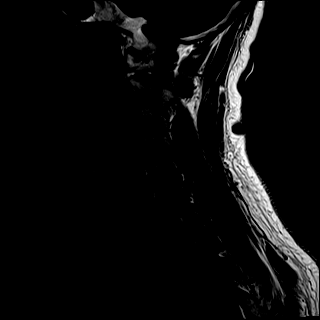
[im 12/18]
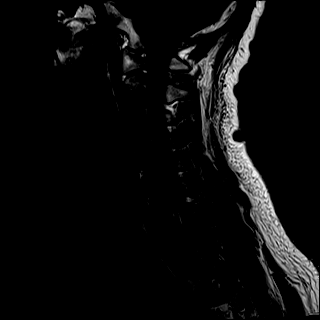
[im 18/18]
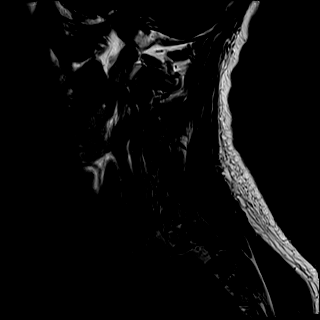

[Series 7: STIR · sagittal · 3.0mm · 0.86mm/px · 4 of 18 slices shown]
[im 1/18]
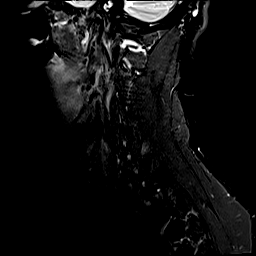
[im 6/18]
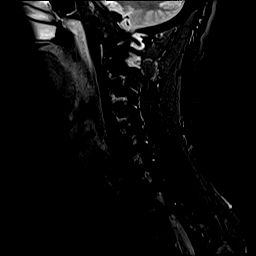
[im 12/18]
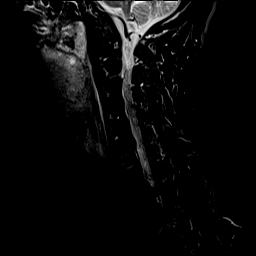
[im 18/18]
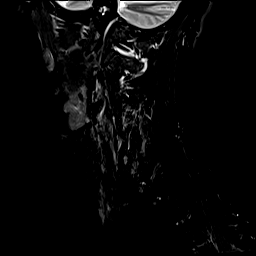

[Series 8: T2 · axial · 3.0mm · 0.62mm/px · z∈[-86,+72]mm · 9 of 50 slices shown (2 of 2)]
[im 1/50]
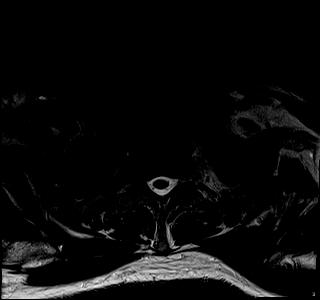
[im 9/50]
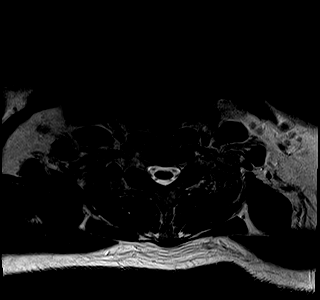
[im 14/50]
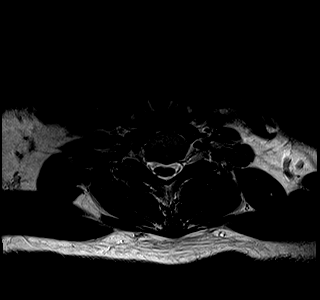
[im 23/50]
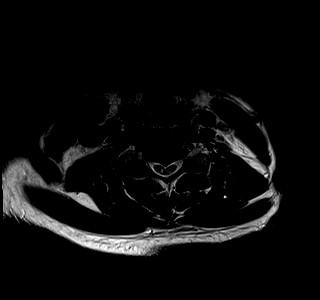
[im 27/50]
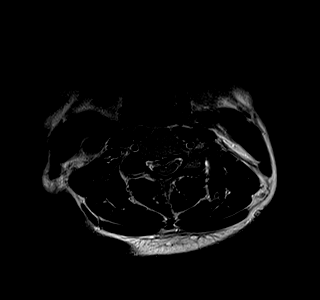
[im 36/50]
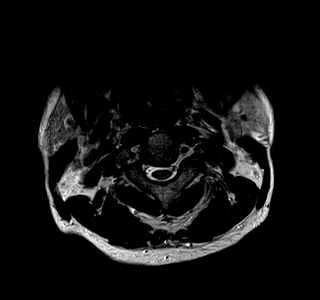
[im 41/50]
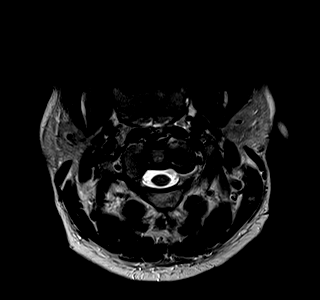
[im 45/50]
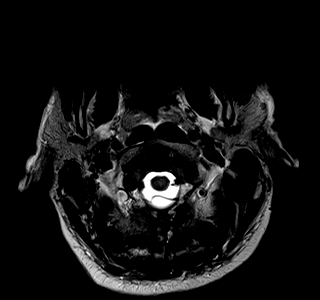
[im 50/50]
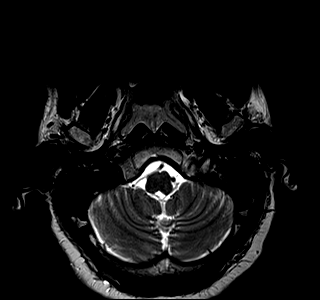

[Series 9: GRE · axial · 3.0mm · 0.39mm/px · z∈[-87,+70]mm · 8 of 50 slices shown (1 of 2)]
[im 1/50]
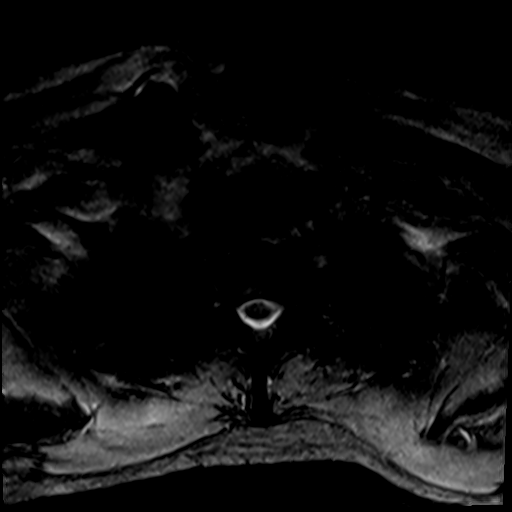
[im 9/50]
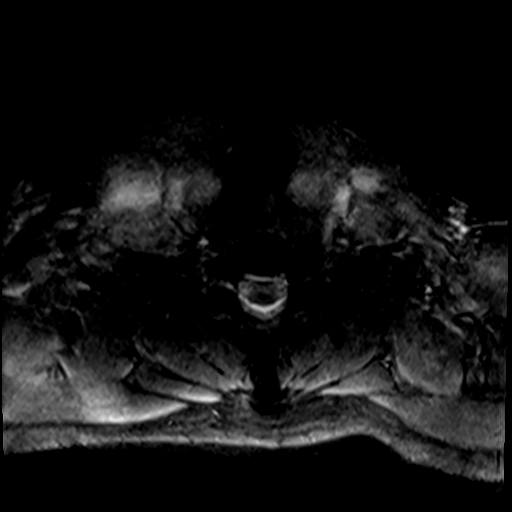
[im 14/50]
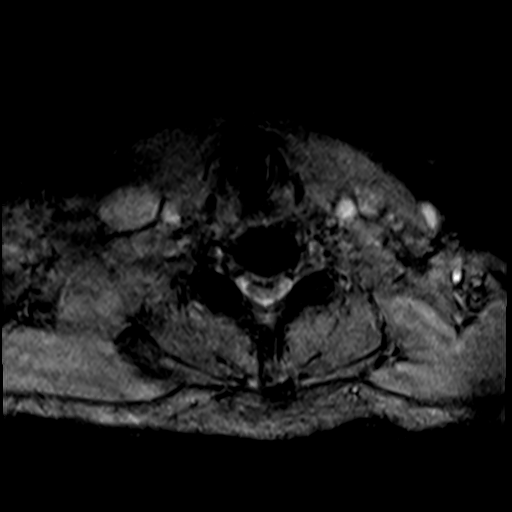
[im 23/50]
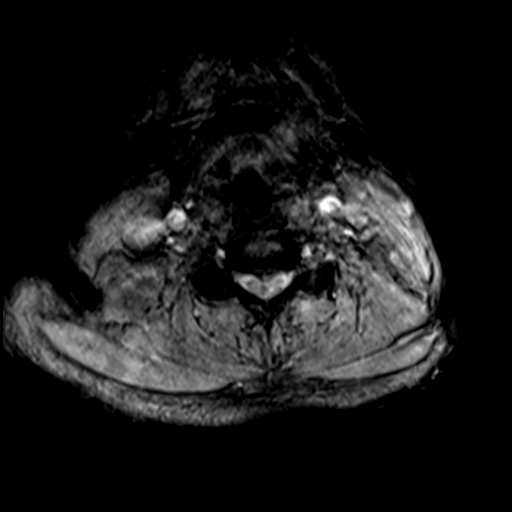
[im 27/50]
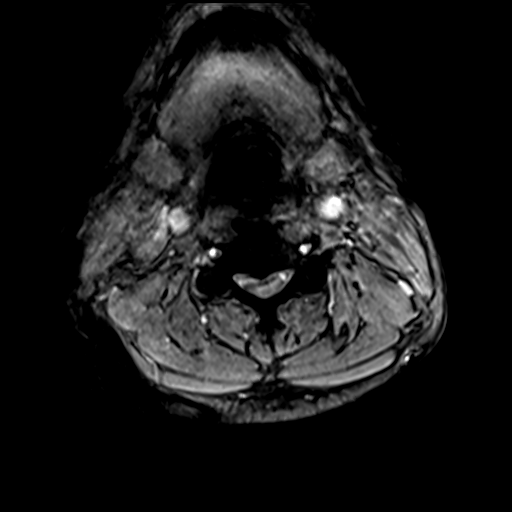
[im 36/50]
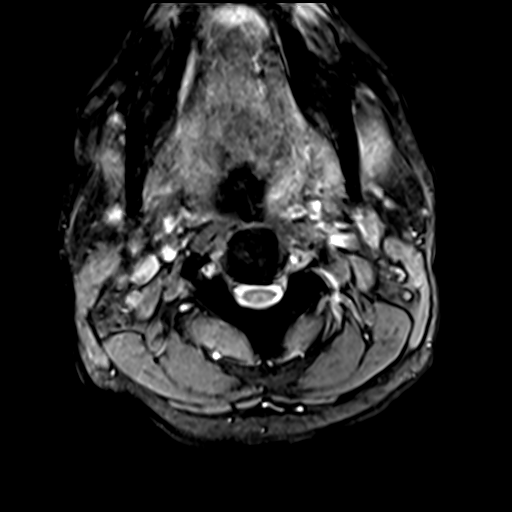
[im 41/50]
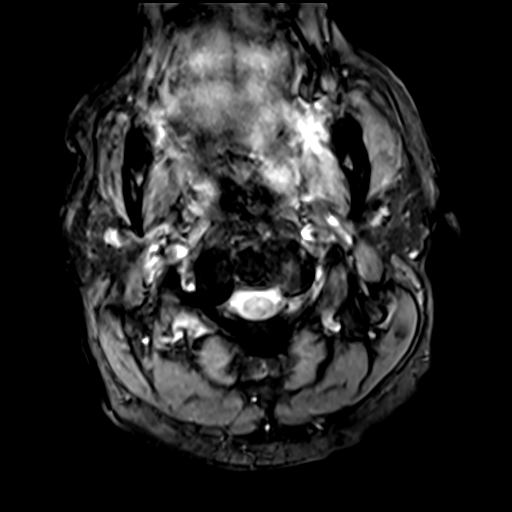
[im 50/50]
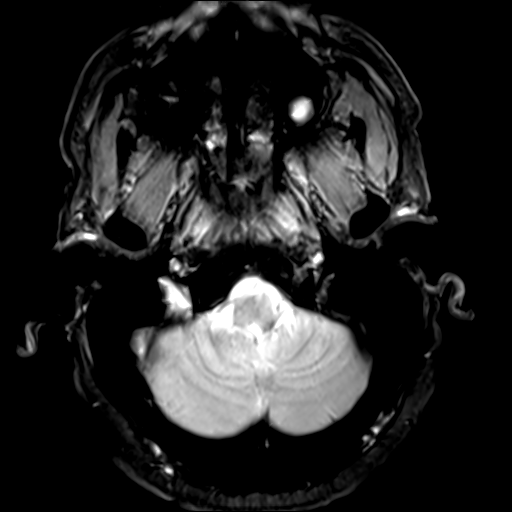

[Series 10: GRE · axial · 3.0mm · 0.39mm/px · z∈[-86,-44]mm · 3 of 50 slices shown (2 of 2)]
[im 1/50]
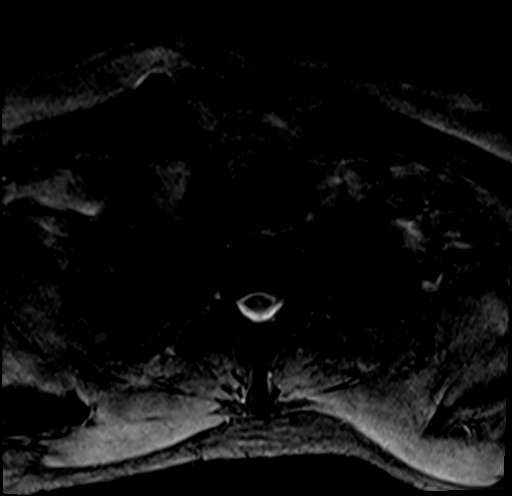
[im 9/50]
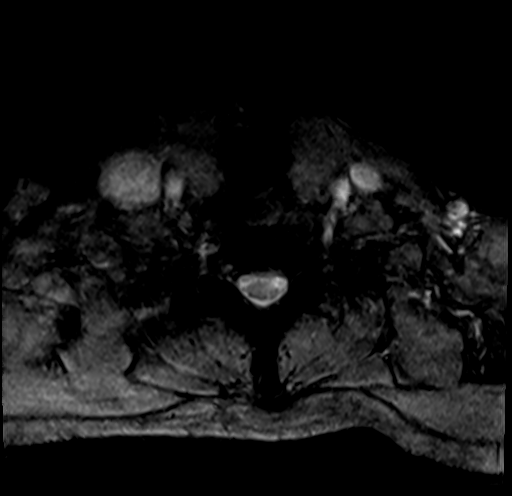
[im 14/50]
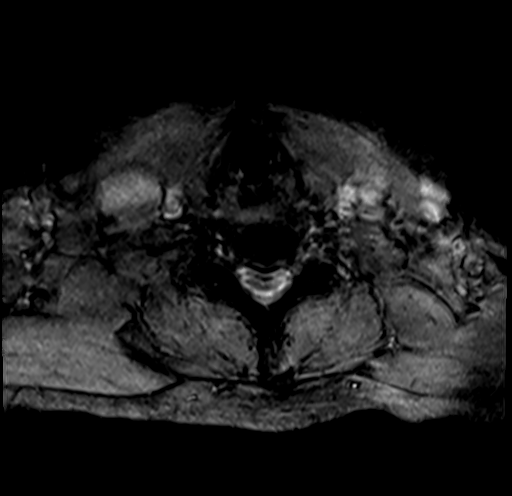

[32 of 48 positions shown; findings below may reference images not displayed]

FINDINGS: Alignment: Physiologic.

Vertebrae: No fracture, evidence of discitis, or bone lesion.

Cord: Mild hyperintense T2-weighted signal within the spinal cord at
the C3-4 level.

Posterior Fossa, vertebral arteries, paraspinal tissues: Negative.

Disc levels:

C1-2: Unremarkable.

C2-3: Small left subarticular disc protrusion. There is no spinal
canal stenosis. No neural foraminal stenosis.

C3-4: Medium-sized central disc protrusion. Severe spinal canal
stenosis. Mass effect on the spinal cord with deformity and signal
change. Severe left neural foraminal stenosis.

C4-5: Medium-sized central disc protrusion deforming the spinal
cord. Mild spinal canal stenosis. No neural foraminal stenosis.

C5-6: Mild disc bulge slightly indenting the ventral spinal cord.
There is no spinal canal stenosis. No neural foraminal stenosis.

C6-7: Normal disc space and facet joints. There is no spinal canal
stenosis. No neural foraminal stenosis.

C7-T1: Normal disc space and facet joints. There is no spinal canal
stenosis. No neural foraminal stenosis.
IMPRESSION: 1. Severe spinal canal stenosis at C3-4 with deformity and signal
change within the spinal cord consistent with compressive
myelopathy.
2. Severe left C3-4 neural foraminal stenosis.
3. Mild C4-5 spinal canal stenosis.

## 2021-12-12 MED ORDER — OXYCODONE HCL 5 MG PO TABS: 5.0000 mg | ORAL_TABLET | ORAL | 0 refills | Status: DC | PRN

## 2021-12-12 MED ORDER — LIDOCAINE 5 % EX PTCH
1.0000 | MEDICATED_PATCH | CUTANEOUS | Status: DC
  Administered 2021-12-12: 1 via TRANSDERMAL
  Filled 2021-12-12: qty 1

## 2021-12-12 MED ORDER — ORPHENADRINE CITRATE ER 100 MG PO TB12: 100.0000 mg | ORAL_TABLET | Freq: Two times a day (BID) | ORAL | 0 refills | Status: DC

## 2021-12-12 MED ORDER — CYCLOBENZAPRINE HCL 10 MG PO TABS
5.0000 mg | ORAL_TABLET | Freq: Once | ORAL | Status: AC
  Administered 2021-12-12: 5 mg via ORAL
  Filled 2021-12-12: qty 1

## 2021-12-12 MED ORDER — KETOROLAC TROMETHAMINE 15 MG/ML IJ SOLN
15.0000 mg | Freq: Once | INTRAMUSCULAR | Status: AC
Start: 2021-12-12 — End: 2021-12-12
  Administered 2021-12-12: 15 mg via INTRAVENOUS
  Filled 2021-12-12: qty 1

## 2021-12-12 NOTE — ED Provider Notes (Signed)
Care assumed form Dr. Karene FryLawsing, patient with right sided weakness, negative MRI brain pending MRI cervical spine. Plan is to discharge if negative. ? ?MRI does show evidence of spinal stenosis at C3-4 as well as neuraminal stenosis on the left at the same level.  I have independently viewed the images, and agree with the radiologist's interpretation.  Case has been discussed with Dr. Conchita ParisNundkumar, on-call for neurosurgery who has reviewed the reports and does not feel that this is an acute process that needs emergent treatment.  He will see the patient in his office for further evaluation.  Patient was asleep when I went in to talk with him about the results, his wife was present and I have explained the findings to them.  He is advised to use over-the-counter NSAIDs and acetaminophen for pain, given prescription for orphenadrine and a small number of oxycodone tablets.  Advised to return if symptoms are worsening, otherwise follow-up with the neurosurgeon. ? ?Results for orders placed or performed during the hospital encounter of 12/11/21  ?Resp Panel by RT-PCR (Flu A&B, Covid) Nasopharyngeal Swab  ? Specimen: Nasopharyngeal Swab; Nasopharyngeal(NP) swabs in vial transport medium  ?Result Value Ref Range  ? SARS Coronavirus 2 by RT PCR NEGATIVE NEGATIVE  ? Influenza A by PCR NEGATIVE NEGATIVE  ? Influenza B by PCR NEGATIVE NEGATIVE  ?Ethanol  ?Result Value Ref Range  ? Alcohol, Ethyl (B) <10 <10 mg/dL  ?Protime-INR  ?Result Value Ref Range  ? Prothrombin Time 13.3 11.4 - 15.2 seconds  ? INR 1.0 0.8 - 1.2  ?APTT  ?Result Value Ref Range  ? aPTT 32 24 - 36 seconds  ?CBC  ?Result Value Ref Range  ? WBC 5.9 4.0 - 10.5 K/uL  ? RBC 5.00 4.22 - 5.81 MIL/uL  ? Hemoglobin 12.7 (L) 13.0 - 17.0 g/dL  ? HCT 40.2 39.0 - 52.0 %  ? MCV 80.4 80.0 - 100.0 fL  ? MCH 25.4 (L) 26.0 - 34.0 pg  ? MCHC 31.6 30.0 - 36.0 g/dL  ? RDW 16.4 (H) 11.5 - 15.5 %  ? Platelets 227 150 - 400 K/uL  ? nRBC 0.0 0.0 - 0.2 %  ?Differential  ?Result Value Ref  Range  ? Neutrophils Relative % 52 %  ? Neutro Abs 3.1 1.7 - 7.7 K/uL  ? Lymphocytes Relative 34 %  ? Lymphs Abs 2.0 0.7 - 4.0 K/uL  ? Monocytes Relative 9 %  ? Monocytes Absolute 0.5 0.1 - 1.0 K/uL  ? Eosinophils Relative 4 %  ? Eosinophils Absolute 0.3 0.0 - 0.5 K/uL  ? Basophils Relative 1 %  ? Basophils Absolute 0.0 0.0 - 0.1 K/uL  ? Immature Granulocytes 0 %  ? Abs Immature Granulocytes 0.01 0.00 - 0.07 K/uL  ?Comprehensive metabolic panel  ?Result Value Ref Range  ? Sodium 140 135 - 145 mmol/L  ? Potassium 3.7 3.5 - 5.1 mmol/L  ? Chloride 109 98 - 111 mmol/L  ? CO2 23 22 - 32 mmol/L  ? Glucose, Bld 116 (H) 70 - 99 mg/dL  ? BUN 17 6 - 20 mg/dL  ? Creatinine, Ser 1.39 (H) 0.61 - 1.24 mg/dL  ? Calcium 8.7 (L) 8.9 - 10.3 mg/dL  ? Total Protein 7.0 6.5 - 8.1 g/dL  ? Albumin 3.5 3.5 - 5.0 g/dL  ? AST 20 15 - 41 U/L  ? ALT 19 0 - 44 U/L  ? Alkaline Phosphatase 80 38 - 126 U/L  ? Total Bilirubin 0.6 0.3 - 1.2 mg/dL  ?  GFR, Estimated 58 (L) >60 mL/min  ? Anion gap 8 5 - 15  ?Urine rapid drug screen (hosp performed)  ?Result Value Ref Range  ? Opiates NONE DETECTED NONE DETECTED  ? Cocaine NONE DETECTED NONE DETECTED  ? Benzodiazepines NONE DETECTED NONE DETECTED  ? Amphetamines NONE DETECTED NONE DETECTED  ? Tetrahydrocannabinol NONE DETECTED NONE DETECTED  ? Barbiturates NONE DETECTED NONE DETECTED  ?Urinalysis, Routine w reflex microscopic Nasopharyngeal Swab  ?Result Value Ref Range  ? Color, Urine YELLOW YELLOW  ? APPearance CLEAR CLEAR  ? Specific Gravity, Urine 1.026 1.005 - 1.030  ? pH 5.0 5.0 - 8.0  ? Glucose, UA NEGATIVE NEGATIVE mg/dL  ? Hgb urine dipstick NEGATIVE NEGATIVE  ? Bilirubin Urine NEGATIVE NEGATIVE  ? Ketones, ur NEGATIVE NEGATIVE mg/dL  ? Protein, ur NEGATIVE NEGATIVE mg/dL  ? Nitrite NEGATIVE NEGATIVE  ? Leukocytes,Ua NEGATIVE NEGATIVE  ?Sedimentation rate  ?Result Value Ref Range  ? Sed Rate 8 0 - 16 mm/hr  ?C-reactive protein  ?Result Value Ref Range  ? CRP 0.7 <1.0 mg/dL  ?I-stat chem 8, ED   ?Result Value Ref Range  ? Sodium 142 135 - 145 mmol/L  ? Potassium 3.6 3.5 - 5.1 mmol/L  ? Chloride 109 98 - 111 mmol/L  ? BUN 19 6 - 20 mg/dL  ? Creatinine, Ser 1.30 (H) 0.61 - 1.24 mg/dL  ? Glucose, Bld 115 (H) 70 - 99 mg/dL  ? Calcium, Ion 1.09 (L) 1.15 - 1.40 mmol/L  ? TCO2 24 22 - 32 mmol/L  ? Hemoglobin 13.9 13.0 - 17.0 g/dL  ? HCT 41.0 39.0 - 52.0 %  ?CBG monitoring, ED  ?Result Value Ref Range  ? Glucose-Capillary 102 (H) 70 - 99 mg/dL  ? ?MR BRAIN WO CONTRAST ? ?Result Date: 12/11/2021 ?CLINICAL DATA:  Acute neurologic deficit EXAM: MRI HEAD WITHOUT CONTRAST TECHNIQUE: Multiplanar, multiecho pulse sequences of the brain and surrounding structures were obtained without intravenous contrast. COMPARISON:  None. FINDINGS: Brain: No acute infarct, mass effect or extra-axial collection. No acute or chronic hemorrhage. There is multifocal hyperintense T2-weighted signal within the Siebert matter. Parenchymal volume and CSF spaces are normal. The midline structures are normal. Vascular: Major flow voids are preserved. Skull and upper cervical spine: Normal calvarium and skull base. Visualized upper cervical spine and soft tissues are normal. Sinuses/Orbits:No paranasal sinus fluid levels or advanced mucosal thickening. No mastoid or middle ear effusion. Normal orbits. IMPRESSION: 1. No acute intracranial abnormality. 2. Findings of chronic microvascular ischemia. Electronically Signed   By: Deatra Robinson M.D.   On: 12/11/2021 23:43  ? ?MR Cervical Spine Wo Contrast ? ?Result Date: 12/12/2021 ?CLINICAL DATA:  Acute myelopathy EXAM: MRI CERVICAL SPINE WITHOUT CONTRAST TECHNIQUE: Multiplanar, multisequence MR imaging of the cervical spine was performed. No intravenous contrast was administered. COMPARISON:  None. FINDINGS: Alignment: Physiologic. Vertebrae: No fracture, evidence of discitis, or bone lesion. Cord: Mild hyperintense T2-weighted signal within the spinal cord at the C3-4 level. Posterior Fossa, vertebral  arteries, paraspinal tissues: Negative. Disc levels: C1-2: Unremarkable. C2-3: Small left subarticular disc protrusion. There is no spinal canal stenosis. No neural foraminal stenosis. C3-4: Medium-sized central disc protrusion. Severe spinal canal stenosis. Mass effect on the spinal cord with deformity and signal change. Severe left neural foraminal stenosis. C4-5: Medium-sized central disc protrusion deforming the spinal cord. Mild spinal canal stenosis. No neural foraminal stenosis. C5-6: Mild disc bulge slightly indenting the ventral spinal cord. There is no spinal canal stenosis. No neural foraminal stenosis. C6-7:  Normal disc space and facet joints. There is no spinal canal stenosis. No neural foraminal stenosis. C7-T1: Normal disc space and facet joints. There is no spinal canal stenosis. No neural foraminal stenosis. IMPRESSION: 1. 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 2. Severe left C3-4 neural foraminal stenosis. 3. Mild C4-5 spinal canal stenosis. Electronically Signed   By: Deatra Robinson M.D.   On: 12/12/2021 03:57  ? ?CT HEAD CODE STROKE WO CONTRAST ? ?Result Date: 12/11/2021 ?CLINICAL DATA:  Code stroke. EXAM: CT HEAD WITHOUT CONTRAST TECHNIQUE: Contiguous axial images were obtained from the base of the skull through the vertex without intravenous contrast. RADIATION DOSE REDUCTION: This exam was performed according to the departmental dose-optimization program which includes automated exposure control, adjustment of the mA and/or kV according to patient size and/or use of iterative reconstruction technique. COMPARISON:  06/20/2014. FINDINGS: Brain: No evidence of acute infarction, hemorrhage, cerebral edema, mass, mass effect, or midline shift. Ventricles and sulci are normal for age. No extra-axial fluid collection. Vascular: No hyperdense vessel or unexpected calcification. Skull: Normal. Negative for  fracture or focal lesion. Sinuses/Orbits: Mucous retention cysts in the maxillary sinuses. Hypoplastic frontal sinuses. The orbits are unremarkable. Other: The mastoid air cells are well aerated. ASPECTS (Al

## 2021-12-12 NOTE — Discharge Instructions (Addendum)
Apply ice to the neck for 30 minutes at a time, 4 times a day. ? ?You may continue to use the lidocaine patches as needed. ? ?Please take either ibuprofen or naproxen as needed for pain.  To get additional pain relief, add acetaminophen.  Please be aware that when you combine acetaminophen with either ibuprofen or naproxen, you get better pain relief than you would get from taking either medication by itself. ? ?Reserve oxycodone for pain that is not relieved with the combination of acetaminophen and either ibuprofen or naproxen. ? ?Please follow-up with the neurosurgeon for further evaluation. ? ?Return if your symptoms are getting worse. ?

## 2022-01-02 ENCOUNTER — Other Ambulatory Visit: Payer: Self-pay | Admitting: Neurosurgery

## 2022-01-02 DIAGNOSIS — Z01818 Encounter for other preprocedural examination: Secondary | ICD-10-CM

## 2022-01-07 ENCOUNTER — Other Ambulatory Visit: Payer: Self-pay

## 2022-01-07 ENCOUNTER — Encounter
Admission: RE | Admit: 2022-01-07 | Discharge: 2022-01-07 | Disposition: A | Discharge status: Home / Self Care | Payer: BC Managed Care – PPO | Source: Ambulatory Visit | Attending: Neurosurgery | Admitting: Neurosurgery

## 2022-01-07 VITALS — BP 136/86 | Temp 98.2°F | Resp 20 | Ht 72.0 in | Wt 227.8 lb

## 2022-01-07 DIAGNOSIS — Z01812 Encounter for preprocedural laboratory examination: Secondary | ICD-10-CM | POA: Diagnosis present

## 2022-01-07 DIAGNOSIS — Z01818 Encounter for other preprocedural examination: Secondary | ICD-10-CM

## 2022-01-07 LAB — SURGICAL PCR SCREEN
MRSA, PCR: NEGATIVE
Staphylococcus aureus: POSITIVE — AB

## 2022-01-07 LAB — TYPE AND SCREEN
ABO/RH(D): O POS
Antibody Screen: NEGATIVE

## 2022-01-07 NOTE — Patient Instructions (Addendum)
Your procedure is scheduled on: 01/16/2022 ?Report to the Registration Desk on the 1st floor of the [facility]. ?To find out your arrival time, please call [phone] between 1PM - 3PM on: 01/15/2022  ?If your arrival time is 6:00 am, do not arrive prior to that time as the [facility] entrance doors do not open until 6:00 am. ? ?REMEMBER: ?Instructions that are not followed completely may result in serious medical risk, up to and including death; or upon the discretion of your surgeon and anesthesiologist your surgery may need to be rescheduled. ? ?Do not eat food after midnight the night before surgery.  ?No gum chewing, lozengers or hard candies. ? ?You may however, drink CLEAR liquids up to 2 hours before you are scheduled to arrive for your surgery. Do not drink anything within 2 hours of your scheduled arrival time. ? ?Clear liquids include: ?- water  ?- apple juice without pulp ?- gatorade (not RED colors) ?- black coffee or tea (Do NOT add milk or creamers to the coffee or tea) ?Do NOT drink anything that is not on this list. ? ? ?TAKE THESE MEDICATIONS THE MORNING OF SURGERY WITH A SIP OF WATER: ?AMLODIPNE ?OXYCODONE IF NEEDED ? ? ? ?Follow recommendations from Cardiologist, Pulmonologist or PCP regarding stopping Aspirin.  ? ?One week prior to surgery: ?Stop Anti-inflammatories (NSAIDS) such as Advil, Aleve, Ibuprofen, Motrin, Naproxen, Naprosyn and Aspirin based products such as Excedrin, Goodys Powder, BC Powder. ?Stop ANY OVER THE COUNTER supplements until after surgery LIKE MULTIVITAMIN,FERROUS SULFATE, Elderberry ?You may however, continue to take Tylenol if needed for pain up until the day of surgery. ? ?No Alcohol for 24 hours before or after surgery. ? ?No Smoking including e-cigarettes for 24 hours prior to surgery.  ?No chewable tobacco products for at least 6 hours prior to surgery.  ?No nicotine patches on the day of surgery. ? ?Do not use any "recreational" drugs for at least a week prior  to your surgery.  ?Please be advised that the combination of cocaine and anesthesia may have negative outcomes, up to and including death. ?If you test positive for cocaine, your surgery will be cancelled. ? ?On the morning of surgery brush your teeth with toothpaste and water, you may rinse your mouth with mouthwash if you wish. ?Do not swallow any toothpaste or mouthwash. ? ?Use CHG Soap as directed on instruction sheet.=- PROVIDED FOR YOU ? ?Do not wear jewelry, make-up, hairpins, clips or nail polish. ? ?Do not wear lotions, powders, or perfumes.  ? ?Do not shave body from the neck down 48 hours prior to surgery just in case you cut yourself which could leave a site for infection.  ?Also, freshly shaved skin may become irritated if using the CHG soap. ? ?Contact lenses, hearing aids and dentures may not be worn into surgery. ? ?Do not bring valuables to the hospital. [facility] is not responsible for any missing/lost belongings or valuables.  ? ?Notify your doctor if there is any change in your medical condition (cold, fever, infection). ? ?Wear comfortable clothing (specific to your surgery type) to the hospital. ? ?After surgery, you can help prevent lung complications by doing breathing exercises.  ?Take deep breaths and cough every 1-2 hours. Your doctor may order a device called an Incentive Spirometer to help you take deep breaths. ? ?If you are being admitted to the hospital overnight, leave your suitcase in the car. ?After surgery it may be brought to your room. ? ?If  you are being discharged the day of surgery, you will not be allowed to drive home. ?You will need a responsible adult (18 years or older) to drive you home and stay with you that night.  ? ?If you are taking public transportation, you will need to have a responsible adult (18 years or older) with you. ?Please confirm with your physician that it is acceptable to use public transportation.  ? ?Please call the Pre-admissions Testing Dept. at  [phone] if you have any questions about these instructions. ? ?Surgery Visitation Policy: ? ?Patients undergoing a surgery or procedure may have two family members or support persons with them as long as the person is not COVID-19 positive or experiencing its symptoms.  ? ?Inpatient Visitation:   ? ?Visiting hours are 7 a.m. to 8 p.m. ?Up to four visitors are allowed at one time in a patient room, including children. The visitors may rotate out with other people during the day. One designated support person (adult) may remain overnight.  ?

## 2022-01-15 MED ORDER — LACTATED RINGERS IV SOLN: INTRAVENOUS | Status: DC

## 2022-01-15 MED ORDER — CHLORHEXIDINE GLUCONATE 0.12 % MT SOLN: 15.0000 mL | Freq: Once | OROMUCOSAL | Status: AC

## 2022-01-15 MED ORDER — ORAL CARE MOUTH RINSE: 15.0000 mL | Freq: Once | OROMUCOSAL | Status: AC

## 2022-01-16 ENCOUNTER — Ambulatory Visit: Payer: BC Managed Care – PPO | Admitting: Anesthesiology

## 2022-01-16 ENCOUNTER — Ambulatory Visit
Admission: RE | Admit: 2022-01-16 | Discharge: 2022-01-16 | Disposition: A | Discharge status: Home / Self Care | Payer: BC Managed Care – PPO | Attending: Neurosurgery | Admitting: Neurosurgery

## 2022-01-16 ENCOUNTER — Other Ambulatory Visit: Payer: Self-pay

## 2022-01-16 ENCOUNTER — Encounter: Payer: Self-pay | Admitting: Neurosurgery

## 2022-01-16 ENCOUNTER — Encounter
Admission: RE | Disposition: A | Discharge status: Home / Self Care | Payer: Self-pay | Source: Home / Self Care | Attending: Neurosurgery

## 2022-01-16 ENCOUNTER — Ambulatory Visit: Payer: BC Managed Care – PPO

## 2022-01-16 DIAGNOSIS — G992 Myelopathy in diseases classified elsewhere: Secondary | ICD-10-CM | POA: Diagnosis not present

## 2022-01-16 DIAGNOSIS — M4802 Spinal stenosis, cervical region: Secondary | ICD-10-CM | POA: Diagnosis present

## 2022-01-16 DIAGNOSIS — Z01818 Encounter for other preprocedural examination: Secondary | ICD-10-CM

## 2022-01-16 HISTORY — PX: ANTERIOR CERVICAL DECOMP/DISCECTOMY FUSION: SHX1161

## 2022-01-16 LAB — ABO/RH: ABO/RH(D): O POS

## 2022-01-16 IMAGING — RF DG CERVICAL SPINE 2 OR 3 VIEWS
1 series · 3 of 3 positions shown · non-contrast
Comparison: None Available.

CLINICAL DATA: Anterior cervical fusion

EXAM:
CERVICAL SPINE - 2-3 VIEW

[Series 1: dg x-ray · 0.20mm/px · 3 of 3 slices shown]
[im 1/3]
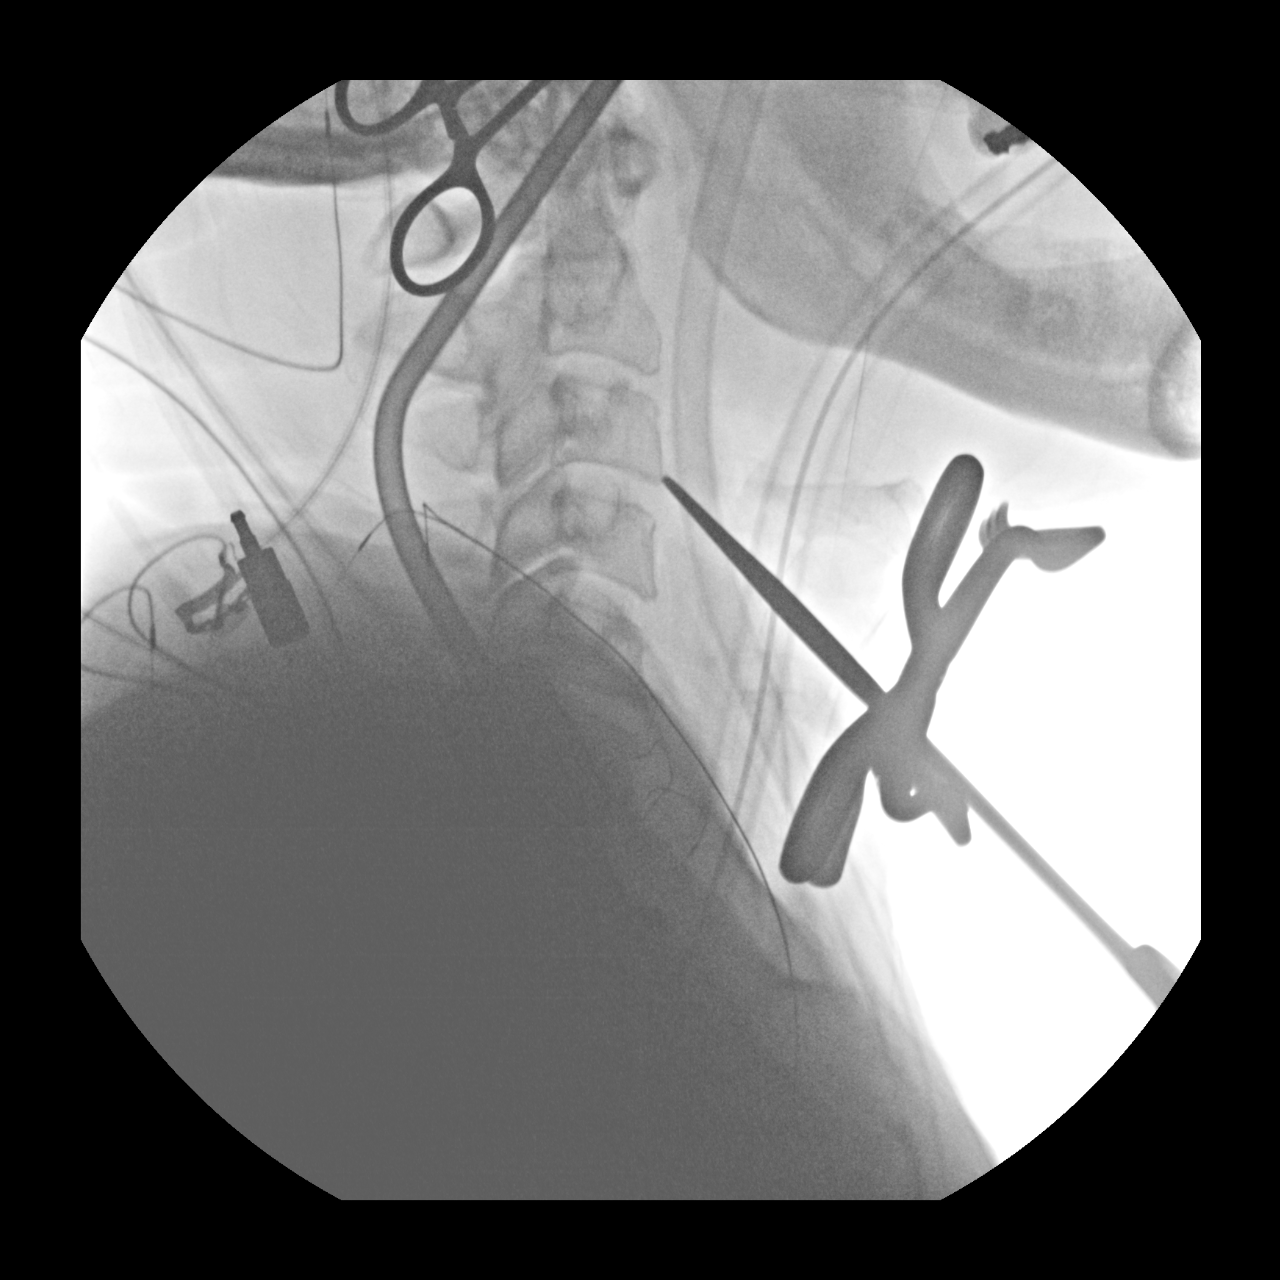
[im 2/3]
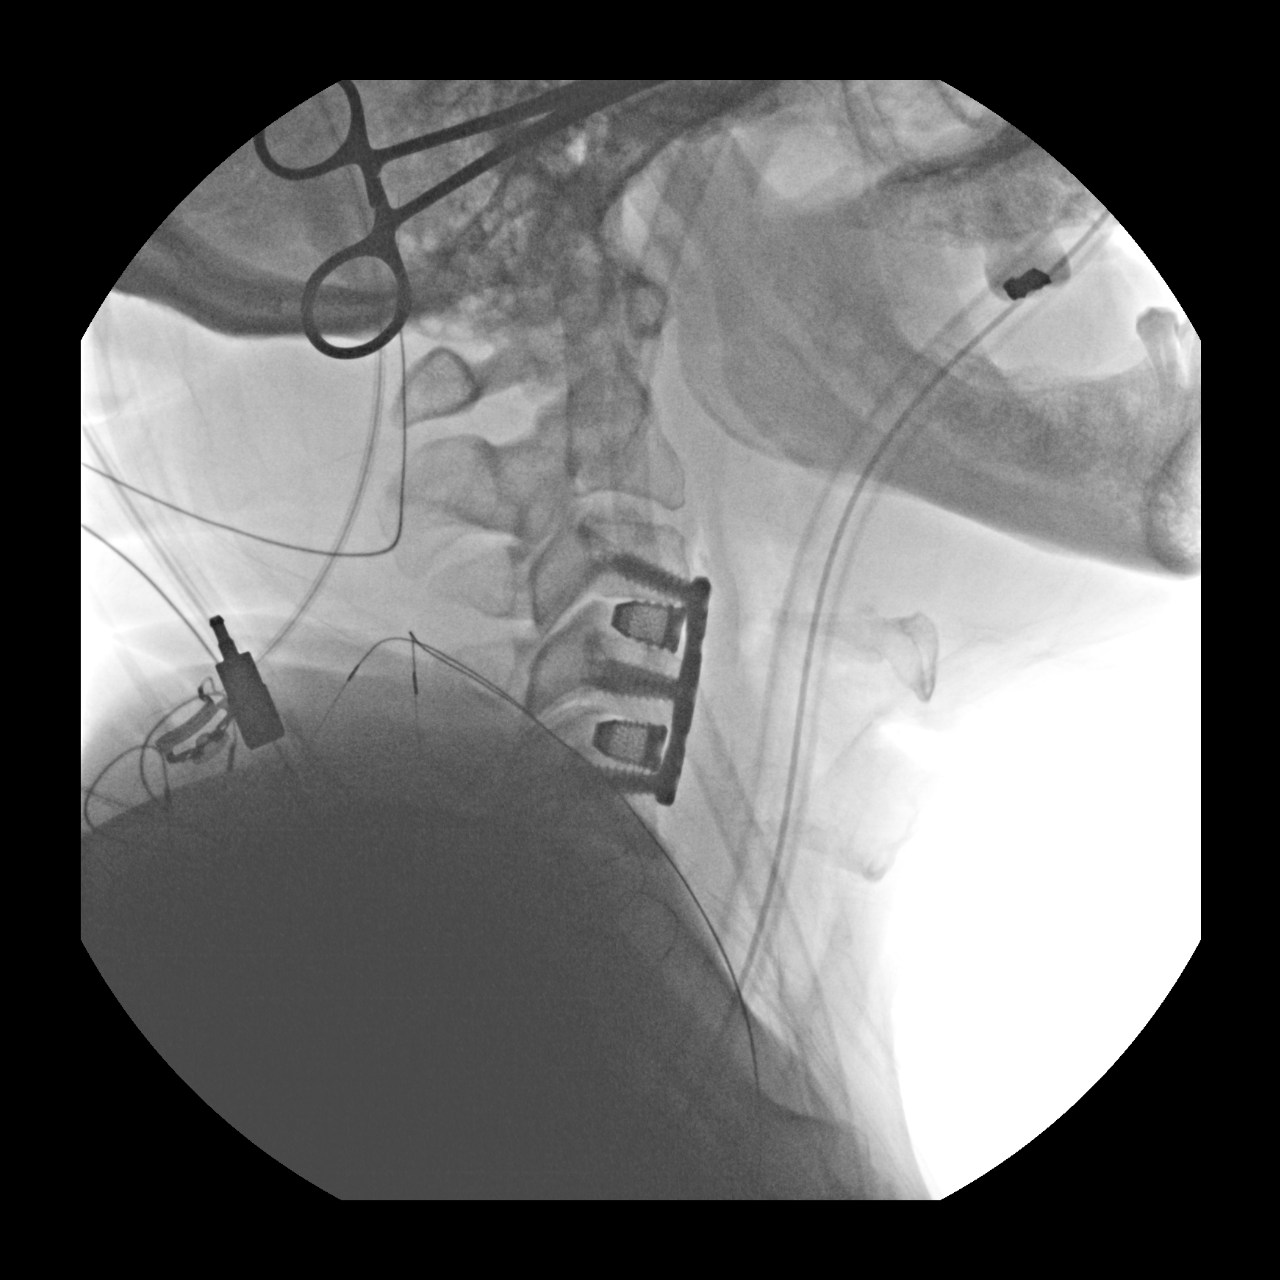
[im 3/3]
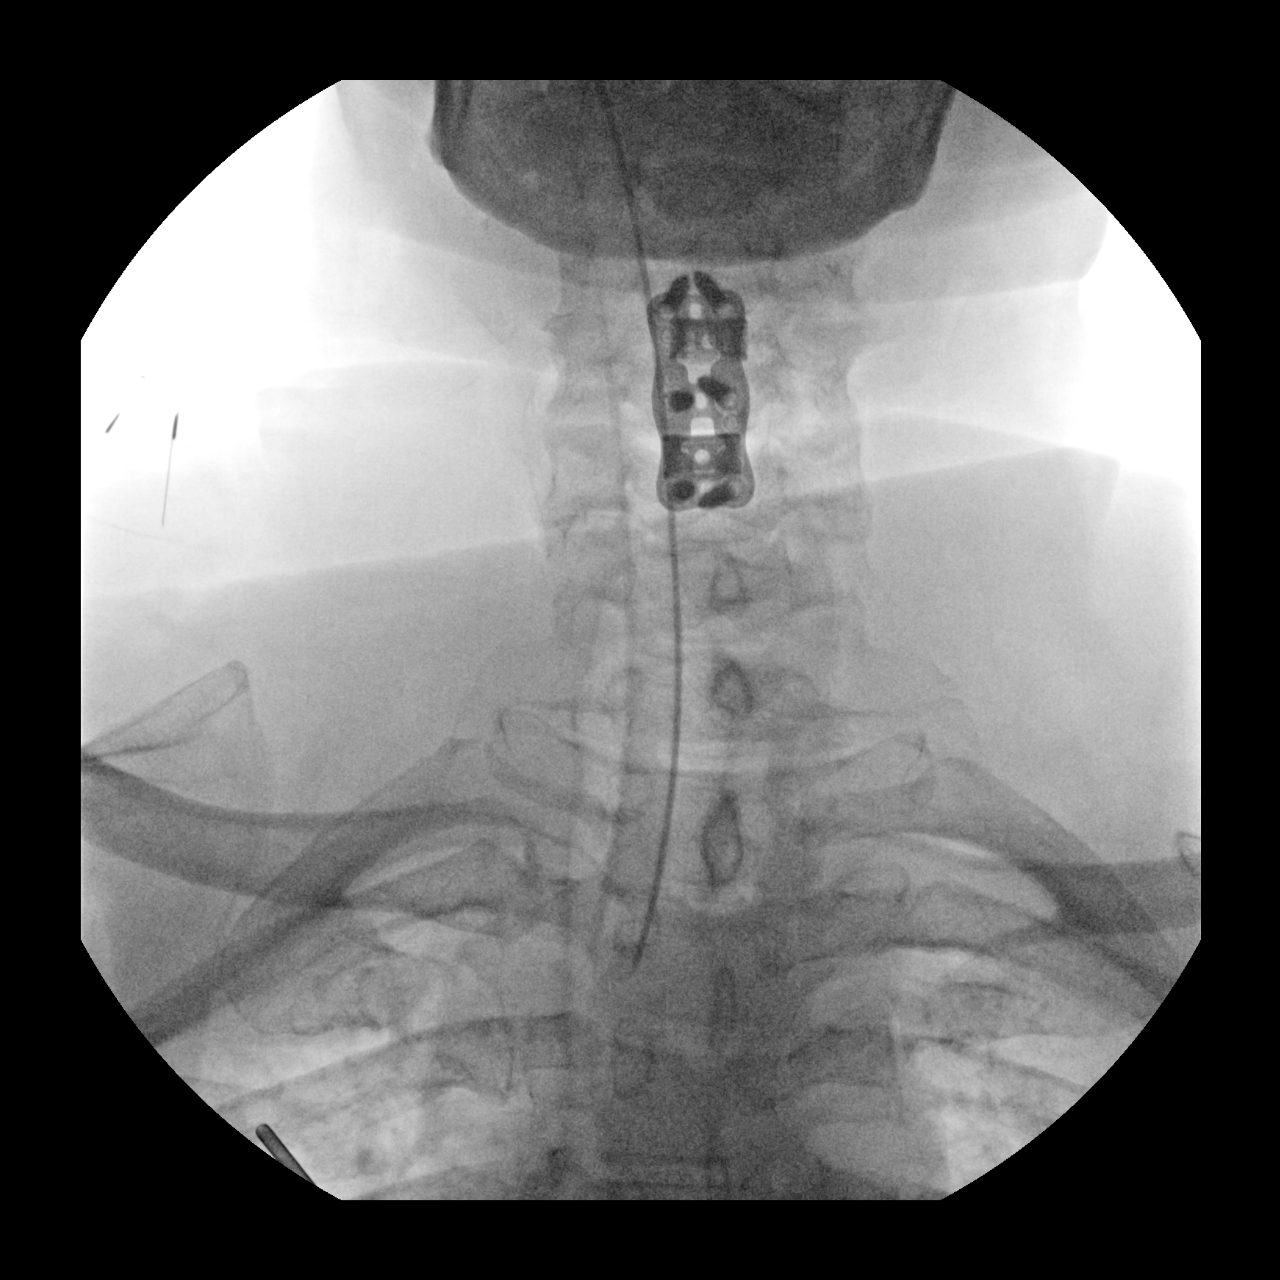

[3 of 3 positions shown; findings below may reference images not displayed]

FINDINGS: Three intraoperative images are submitted. On the first image, there
is a localizer at the C3 level. On the second image, there is ACDF
at C3-C5 with anterior plate and screw fixation and interbody
grafts. Third image is a frontal view showing the same.
IMPRESSION: Intraoperative guidance for C3-C5 ACDF.

## 2022-01-16 SURGERY — ANTERIOR CERVICAL DECOMPRESSION/DISCECTOMY FUSION 2 LEVELS
Anesthesia: General | Site: Spine Cervical

## 2022-01-16 MED ORDER — LACTATED RINGERS IV SOLN: INTRAVENOUS | Status: DC | PRN

## 2022-01-16 MED ORDER — FENTANYL CITRATE (PF) 100 MCG/2ML IJ SOLN
25.0000 ug | INTRAMUSCULAR | Status: AC | PRN
  Administered 2022-01-16 (×2): 25 ug via INTRAVENOUS

## 2022-01-16 MED ORDER — DEXAMETHASONE SODIUM PHOSPHATE 10 MG/ML IJ SOLN
INTRAMUSCULAR | Status: AC
  Filled 2022-01-16: qty 1

## 2022-01-16 MED ORDER — OXYCODONE-ACETAMINOPHEN 7.5-325 MG PO TABS: 1.0000 | ORAL_TABLET | ORAL | 0 refills | Status: AC | PRN

## 2022-01-16 MED ORDER — PROPOFOL 1000 MG/100ML IV EMUL
INTRAVENOUS | Status: AC
  Filled 2022-01-16: qty 100

## 2022-01-16 MED ORDER — REMIFENTANIL HCL 1 MG IV SOLR
INTRAVENOUS | Status: AC
  Filled 2022-01-16: qty 1000

## 2022-01-16 MED ORDER — KETAMINE HCL 10 MG/ML IJ SOLN
INTRAMUSCULAR | Status: DC | PRN
  Administered 2022-01-16: 20 mg via INTRAVENOUS
  Administered 2022-01-16: 30 mg via INTRAVENOUS

## 2022-01-16 MED ORDER — SUCCINYLCHOLINE CHLORIDE 200 MG/10ML IV SOSY
PREFILLED_SYRINGE | INTRAVENOUS | Status: DC | PRN
  Administered 2022-01-16: 140 mg via INTRAVENOUS

## 2022-01-16 MED ORDER — CEFAZOLIN SODIUM-DEXTROSE 2-4 GM/100ML-% IV SOLN
2.0000 g | INTRAVENOUS | Status: AC
  Administered 2022-01-16: 2 g via INTRAVENOUS

## 2022-01-16 MED ORDER — ONDANSETRON HCL 4 MG/2ML IJ SOLN
INTRAMUSCULAR | Status: AC
  Filled 2022-01-16: qty 2

## 2022-01-16 MED ORDER — PROPOFOL 10 MG/ML IV BOLUS
INTRAVENOUS | Status: AC
  Filled 2022-01-16: qty 40

## 2022-01-16 MED ORDER — ACETAMINOPHEN 10 MG/ML IV SOLN
INTRAVENOUS | Status: AC
  Filled 2022-01-16: qty 100

## 2022-01-16 MED ORDER — FENTANYL CITRATE (PF) 100 MCG/2ML IJ SOLN
INTRAMUSCULAR | Status: AC
  Administered 2022-01-16: 25 ug via INTRAVENOUS
  Filled 2022-01-16: qty 2

## 2022-01-16 MED ORDER — CEFAZOLIN SODIUM-DEXTROSE 2-4 GM/100ML-% IV SOLN
INTRAVENOUS | Status: AC
  Filled 2022-01-16: qty 100

## 2022-01-16 MED ORDER — MIDAZOLAM HCL 2 MG/2ML IJ SOLN
INTRAMUSCULAR | Status: DC | PRN
  Administered 2022-01-16: 2 mg via INTRAVENOUS

## 2022-01-16 MED ORDER — REMIFENTANIL HCL 1 MG IV SOLR
INTRAVENOUS | Status: DC | PRN
  Administered 2022-01-16: 100 ug via INTRAVENOUS

## 2022-01-16 MED ORDER — OXYCODONE HCL 5 MG PO TABS
ORAL_TABLET | ORAL | Status: AC
  Administered 2022-01-16: 5 mg via ORAL
  Filled 2022-01-16: qty 1

## 2022-01-16 MED ORDER — HYDROMORPHONE HCL 1 MG/ML IJ SOLN
INTRAMUSCULAR | Status: AC
Start: 2022-01-16 — End: ?
  Filled 2022-01-16: qty 1

## 2022-01-16 MED ORDER — PHENYLEPHRINE HCL-NACL 20-0.9 MG/250ML-% IV SOLN
INTRAVENOUS | Status: DC | PRN
  Administered 2022-01-16: 30 ug/min via INTRAVENOUS

## 2022-01-16 MED ORDER — KETAMINE HCL 50 MG/5ML IJ SOSY
PREFILLED_SYRINGE | INTRAMUSCULAR | Status: AC
  Filled 2022-01-16: qty 5

## 2022-01-16 MED ORDER — EPHEDRINE SULFATE (PRESSORS) 50 MG/ML IJ SOLN
INTRAMUSCULAR | Status: DC | PRN
  Administered 2022-01-16 (×2): 10 mg via INTRAVENOUS

## 2022-01-16 MED ORDER — LIDOCAINE HCL (PF) 2 % IJ SOLN
INTRAMUSCULAR | Status: AC
  Filled 2022-01-16: qty 5

## 2022-01-16 MED ORDER — PROPOFOL 500 MG/50ML IV EMUL
INTRAVENOUS | Status: DC | PRN
  Administered 2022-01-16: 175 ug/kg/min via INTRAVENOUS

## 2022-01-16 MED ORDER — FENTANYL CITRATE (PF) 100 MCG/2ML IJ SOLN
25.0000 ug | INTRAMUSCULAR | Status: AC | PRN
  Administered 2022-01-16 (×4): 25 ug via INTRAVENOUS

## 2022-01-16 MED ORDER — SODIUM CHLORIDE 0.9 % IV SOLN
INTRAVENOUS | Status: DC | PRN
  Administered 2022-01-16: .2 ug/kg/min via INTRAVENOUS

## 2022-01-16 MED ORDER — METHOCARBAMOL 750 MG PO TABS: 750.0000 mg | ORAL_TABLET | Freq: Three times a day (TID) | ORAL | Status: DC

## 2022-01-16 MED ORDER — CHLORHEXIDINE GLUCONATE 0.12 % MT SOLN
OROMUCOSAL | Status: AC
Start: 2022-01-16 — End: 2022-01-16
  Administered 2022-01-16: 15 mL via OROMUCOSAL
  Filled 2022-01-16: qty 15

## 2022-01-16 MED ORDER — SURGIFLO WITH THROMBIN (HEMOSTATIC MATRIX KIT) OPTIME
TOPICAL | Status: DC | PRN
Start: 2022-01-16 — End: 2022-01-16
  Administered 2022-01-16: 1 via TOPICAL

## 2022-01-16 MED ORDER — ACETAMINOPHEN 10 MG/ML IV SOLN
INTRAVENOUS | Status: DC | PRN
  Administered 2022-01-16: 1000 mg via INTRAVENOUS

## 2022-01-16 MED ORDER — BUPIVACAINE-EPINEPHRINE (PF) 0.5% -1:200000 IJ SOLN
INTRAMUSCULAR | Status: AC
  Filled 2022-01-16: qty 30

## 2022-01-16 MED ORDER — LIDOCAINE HCL (CARDIAC) PF 100 MG/5ML IV SOSY
PREFILLED_SYRINGE | INTRAVENOUS | Status: DC | PRN
  Administered 2022-01-16: 100 mg via INTRAVENOUS

## 2022-01-16 MED ORDER — PROMETHAZINE HCL 25 MG/ML IJ SOLN: 6.2500 mg | INTRAMUSCULAR | Status: DC | PRN

## 2022-01-16 MED ORDER — SENNA 8.6 MG PO TABS: 1.0000 | ORAL_TABLET | Freq: Every day | ORAL | 0 refills | Status: DC | PRN

## 2022-01-16 MED ORDER — PROPOFOL 10 MG/ML IV BOLUS
INTRAVENOUS | Status: DC | PRN
  Administered 2022-01-16: 200 mg via INTRAVENOUS

## 2022-01-16 MED ORDER — PROPOFOL 10 MG/ML IV BOLUS
INTRAVENOUS | Status: AC
  Filled 2022-01-16: qty 20

## 2022-01-16 MED ORDER — BUPIVACAINE-EPINEPHRINE (PF) 0.5% -1:200000 IJ SOLN
INTRAMUSCULAR | Status: DC | PRN
  Administered 2022-01-16: 5 mL

## 2022-01-16 MED ORDER — SUCCINYLCHOLINE CHLORIDE 200 MG/10ML IV SOSY
PREFILLED_SYRINGE | INTRAVENOUS | Status: AC
  Filled 2022-01-16: qty 10

## 2022-01-16 MED ORDER — ONDANSETRON HCL 4 MG/2ML IJ SOLN
INTRAMUSCULAR | Status: DC | PRN
  Administered 2022-01-16: 4 mg via INTRAVENOUS

## 2022-01-16 MED ORDER — MIDAZOLAM HCL 2 MG/2ML IJ SOLN
INTRAMUSCULAR | Status: AC
  Filled 2022-01-16: qty 2

## 2022-01-16 MED ORDER — OXYCODONE HCL 5 MG PO TABS: 5.0000 mg | ORAL_TABLET | Freq: Once | ORAL | Status: AC

## 2022-01-16 MED ORDER — DEXAMETHASONE SODIUM PHOSPHATE 10 MG/ML IJ SOLN
INTRAMUSCULAR | Status: DC | PRN
  Administered 2022-01-16: 10 mg via INTRAVENOUS

## 2022-01-16 MED ORDER — METHOCARBAMOL 750 MG PO TABS
ORAL_TABLET | ORAL | Status: AC
  Administered 2022-01-16: 750 mg via ORAL
  Filled 2022-01-16: qty 1

## 2022-01-16 MED ORDER — PHENYLEPHRINE HCL-NACL 20-0.9 MG/250ML-% IV SOLN
INTRAVENOUS | Status: AC
  Filled 2022-01-16: qty 250

## 2022-01-16 MED ORDER — 0.9 % SODIUM CHLORIDE (POUR BTL) OPTIME
TOPICAL | Status: DC | PRN
Start: 2022-01-16 — End: 2022-01-16
  Administered 2022-01-16: 500 mL

## 2022-01-16 SURGICAL SUPPLY — 59 items
"PENCIL ELECTRO HAND CTR " (MISCELLANEOUS) ×1 IMPLANT
ALLOGRAFT BONE FIBER KORE 1CC (Bone Implant) ×1 IMPLANT
BULB RESERV EVAC DRAIN JP 100C (MISCELLANEOUS) IMPLANT
BUR NEURO DRILL SOFT 3.0X3.8M (BURR) ×2 IMPLANT
CHLORAPREP W/TINT 26 (MISCELLANEOUS) ×4 IMPLANT
COUNTER NEEDLE 20/40 LG (NEEDLE) ×2 IMPLANT
CUP MEDICINE 2OZ PLAST GRAD ST (MISCELLANEOUS) ×2 IMPLANT
DERMABOND ADVANCED (GAUZE/BANDAGES/DRESSINGS) ×1
DERMABOND ADVANCED .7 DNX12 (GAUZE/BANDAGES/DRESSINGS) ×1 IMPLANT
DRAIN CHANNEL JP 10F RND 20C F (MISCELLANEOUS) IMPLANT
DRAPE C ARM PK CFD 31 SPINE (DRAPES) ×2 IMPLANT
DRAPE LAPAROTOMY 77X122 PED (DRAPES) ×2 IMPLANT
DRAPE MICROSCOPE SPINE 48X150 (DRAPES) ×2 IMPLANT
DRAPE SURG 17X11 SM STRL (DRAPES) ×2 IMPLANT
ELECT CAUTERY BLADE TIP 2.5 (TIP) ×2
ELECT REM PT RETURN 9FT ADLT (ELECTROSURGICAL) ×2
ELECTRODE CAUTERY BLDE TIP 2.5 (TIP) ×1 IMPLANT
ELECTRODE REM PT RTRN 9FT ADLT (ELECTROSURGICAL) ×1 IMPLANT
FEE INTRAOP CADWELL SUPPLY NCS (MISCELLANEOUS) IMPLANT
FEE INTRAOP MONITOR IMPULS NCS (MISCELLANEOUS) IMPLANT
GLOVE BIOGEL PI IND STRL 6.5 (GLOVE) ×2 IMPLANT
GLOVE BIOGEL PI INDICATOR 6.5 (GLOVE) ×2
GLOVE SURG SYN 6.5 ES PF (GLOVE) ×4 IMPLANT
GLOVE SURG SYN 6.5 PF PI (GLOVE) ×2 IMPLANT
GLOVE SURG SYN 8.5  E (GLOVE) ×3
GLOVE SURG SYN 8.5 E (GLOVE) ×3 IMPLANT
GLOVE SURG SYN 8.5 PF PI (GLOVE) ×3 IMPLANT
GOWN SRG LRG LVL 4 IMPRV REINF (GOWNS) ×2 IMPLANT
GOWN SRG XL LVL 3 NONREINFORCE (GOWNS) ×1 IMPLANT
GOWN STRL NON-REIN TWL XL LVL3 (GOWNS) ×1
GOWN STRL REIN LRG LVL4 (GOWNS) ×2
GRADUATE 1200CC STRL 31836 (MISCELLANEOUS) ×2 IMPLANT
INTRAOP CADWELL SUPPLY FEE NCS (MISCELLANEOUS) ×1
INTRAOP DISP SUPPLY FEE NCS (MISCELLANEOUS) ×1
INTRAOP MONITOR FEE IMPULS NCS (MISCELLANEOUS) ×1
INTRAOP MONITOR FEE IMPULSE (MISCELLANEOUS) ×1
KIT TURNOVER KIT A (KITS) ×2 IMPLANT
MANIFOLD NEPTUNE II (INSTRUMENTS) ×2 IMPLANT
MARKER SKIN DUAL TIP RULER LAB (MISCELLANEOUS) ×3 IMPLANT
NDL SAFETY ECLIPSE 18X1.5 (NEEDLE) ×1 IMPLANT
NEEDLE HYPO 18GX1.5 SHARP (NEEDLE) ×1
NEEDLE HYPO 22GX1.5 SAFETY (NEEDLE) ×2 IMPLANT
PACK LAMINECTOMY NEURO (CUSTOM PROCEDURE TRAY) ×2 IMPLANT
PAD ARMBOARD 7.5X6 YLW CONV (MISCELLANEOUS) ×4 IMPLANT
PENCIL ELECTRO HAND CTR (MISCELLANEOUS) ×2 IMPLANT
PIN CASPAR 14 (PIN) ×1 IMPLANT
PIN CASPAR 14MM (PIN) ×2 IMPLANT
PLATE ANT CERV XTEND 2 LV 32 (Plate) ×1 IMPLANT
SCREW VAR SELF DRILL XD 18X4.2 (Screw) IMPLANT
SCREW VAR SELF DRILL XD 4.2 (Screw) ×6 IMPLANT
SPACER HEDRON C 12X14X8 7D (Spacer) ×2 IMPLANT
SPONGE KITTNER 5P (MISCELLANEOUS) ×2 IMPLANT
SURGIFLO W/THROMBIN 8M KIT (HEMOSTASIS) ×2 IMPLANT
SUT V-LOC 90 ABS DVC 3-0 CL (SUTURE) ×2 IMPLANT
SUT VIC AB 3-0 SH 8-18 (SUTURE) ×2 IMPLANT
SYR 30ML LL (SYRINGE) ×2 IMPLANT
TAPE CLOTH 3X10 WHT NS LF (GAUZE/BANDAGES/DRESSINGS) ×5 IMPLANT
TOWEL OR 17X26 4PK STRL BLUE (TOWEL DISPOSABLE) ×6 IMPLANT
TUBING CONNECTING 10 (TUBING) ×3 IMPLANT

## 2022-01-16 NOTE — H&P (Signed)
I have reviewed and confirmed my history and physical from 01/02/2022 with no additions or changes. Plan for C3-5 ACDF.  Risks and benefits reviewed.  Heart sounds normal no MRG. Chest Clear to Auscultation Bilaterally.

## 2022-01-16 NOTE — Anesthesia Procedure Notes (Signed)
Procedure Name: Intubation Date/Time: 01/16/2022 7:30 AM Performed by: Esaw Grandchild, CRNA Pre-anesthesia Checklist: Patient identified, Emergency Drugs available, Suction available and Patient being monitored Patient Re-evaluated:Patient Re-evaluated prior to induction Oxygen Delivery Method: Circle system utilized Preoxygenation: Pre-oxygenation with 100% oxygen Induction Type: IV induction Ventilation: Mask ventilation without difficulty Laryngoscope Size: McGraph and 3 Grade View: Grade I Tube type: Oral Tube size: 7.5 mm Number of attempts: 1 Airway Equipment and Method: Stylet, Oral airway, LTA kit utilized and Bite block Placement Confirmation: ETT inserted through vocal cords under direct vision, positive ETCO2 and breath sounds checked- equal and bilateral Secured at: 23 cm Tube secured with: Tape Dental Injury: Teeth and Oropharynx as per pre-operative assessment

## 2022-01-16 NOTE — Transfer of Care (Signed)
Immediate Anesthesia Transfer of Care Note  Patient: Corey Patterson  Procedure(s) Performed: C3-5 ANTERIOR CERVICAL DISCECTOMY AND FUSION (GLOBUS HEDRON) (Spine Cervical)  Patient Location: PACU  Anesthesia Type:General  Level of Consciousness: drowsy  Airway & Oxygen Therapy: Patient Spontanous Breathing and Patient connected to face mask oxygen  Post-op Assessment: Report given to RN, Post -op Vital signs reviewed and stable and Patient moving all extremities  Post vital signs: Reviewed and stable  Last Vitals:  Vitals Value Taken Time  BP 151/91 01/16/22 0941  Temp    Pulse 96 01/16/22 0941  Resp 15 01/16/22 0941  SpO2 100 % 01/16/22 0941    Last Pain:  Vitals:   01/16/22 0715  TempSrc: Oral  PainSc: 0-No pain         Complications: No notable events documented.

## 2022-01-16 NOTE — Op Note (Signed)
Indications: Mr. Walkins is a 61 yo male who presented with: Cervical myelopathy G95.9, Degenerative cervical spinal stenosis M48.02, Right arm weakness R29.898  He had worsening symptoms prompting surgical intervention  Findings: cervical stenosis  Preoperative Diagnosis: Cervical myelopathy G95.9, Degenerative cervical spinal stenosis M48.02, Right arm weakness R29.898 Postoperative Diagnosis: same   EBL: 25 ml IVF: see AR ml Drains: none Disposition: Extubated and Stable to PACU Complications: none  No foley catheter was placed.   Preoperative Note:   Risks of surgery discussed include: infection, bleeding, stroke, coma, death, paralysis, CSF leak, nerve/spinal cord injury, numbness, tingling, weakness, complex regional pain syndrome, recurrent stenosis and/or disc herniation, vascular injury, development of instability, neck/back pain, need for further surgery, persistent symptoms, development of deformity, and the risks of anesthesia. The patient understood these risks and agreed to proceed.  Operative Note:   Procedure:  1) Anterior cervical diskectomy and fusion at C3/4 and C4/5 2) Anterior cervical instrumentation at C3 - 5 using Globus Xtend 3) Placement of biomechanical devices at C3/4 and C4/5  4) Use of operative microscope 5) Use of flouroscopy   Procedure: After obtaining informed consent, the patient taken to the operating room, placed in supine position, general anesthesia induced.  The patient had a small shoulder roll placed behind their shoulders.  The patient received preop antibiotics and IV Decadron.  The patient had a neck incision outlined, was prepped and draped in usual sterile fashion. The incision was injected with local anesthetic.   An incision was opened, dissection taken down medial to the carotid artery and jugular vein, lateral to the trachea and esophagus.  The prevertebral fascia identified and a localizing x-ray demonstrated the correct level.  The  longus colli were dissected laterally, and self-retaining retractors placed to open the operative field. The microscope was then brought into the field.  With this complete, distractor pins were placed in the vertebral bodies of C3 and C5. The distractor was placed, and the annuli at C3/4 and C4/5 were opened using a bovie.  Curettes and pituitary rongeurs used to remove the majority of disk, then the drill was used to remove the posterior osteophyte and begin the foraminotomies. The nerve hook was used to elevate the posterior longitudinal ligament, which was then removed with Kerrison rongeurs. The microblunt nerve hook could be passed out the foramina bilaterally at each level.   Meticulous hemostasis was obtained.  A biomechanical device (Globus Hedron 8 mm height x 14 mm width by 12 mm depth) was placed at C3/4. A second biomechanical device (Globus Hedron 8 mm height x 14 mm width by 12 mm depth) was placed at C4/5. Each device had been filled with allograft for aid in arthrodesis.  The caspar distractor was removed, and bone wax used for hemostasis. A 32 mm Globus Xtend plate was chosen.  Two screws placed in each vertebral body, respectively making sure the screws were behind the locking mechanism.  Final AP and lateral radiographs were taken.   With everything in good position, the wound was irrigated copiously with bacitracin-containing solution and meticulous hemostasis obtained.  Wound was closed in 2 layers using interrupted inverted 3-0 Vicryl sutures in the platysma and 3-0 monocryl on the dermis.  The wound was dressed with dermabond, the head of bed at 30 degrees, taken to recovery room in stable condition.  No new postop neurological deficits were identified.  Sponge and pattie counts were correct at the end of the procedure.   Monitoring was stable throughout.  I performed the entire procedure with the assistance of Manning Charity PA as an Designer, television/film set.  Venetia Night  MD

## 2022-01-16 NOTE — Anesthesia Postprocedure Evaluation (Signed)
Anesthesia Post Note  Patient: Lavone Neri  Procedure(s) Performed: C3-5 ANTERIOR CERVICAL DISCECTOMY AND FUSION (GLOBUS HEDRON) (Spine Cervical)  Patient location during evaluation: PACU Anesthesia Type: General Level of consciousness: awake and alert Pain management: pain level controlled Vital Signs Assessment: post-procedure vital signs reviewed and stable Respiratory status: spontaneous breathing, nonlabored ventilation, respiratory function stable and patient connected to nasal cannula oxygen Cardiovascular status: blood pressure returned to baseline and stable Postop Assessment: no apparent nausea or vomiting Anesthetic complications: no   No notable events documented.   Last Vitals:  Vitals:   01/16/22 1131 01/16/22 1305  BP:  130/67  Pulse: (P) 87 77  Resp: (P) 15 17  Temp: (!) (P) 36.2 C (!) 36.2 C  SpO2: (P) 99% 97%    Last Pain:  Vitals:   01/16/22 1305  TempSrc: Temporal  PainSc: 0-No pain                 Martha Clan

## 2022-01-16 NOTE — Anesthesia Preprocedure Evaluation (Signed)
Anesthesia Evaluation  Patient identified by MRN, date of birth, ID band Patient awake    Reviewed: Allergy & Precautions, H&P , NPO status , Patient's Chart, lab work & pertinent test results, reviewed documented beta blocker date and time   History of Anesthesia Complications Negative for: history of anesthetic complications  Airway Mallampati: III  TM Distance: >3 FB Neck ROM: full    Dental  (+) Dental Advidsory Given, Partial Upper, Teeth Intact   Pulmonary neg pulmonary ROS,    Pulmonary exam normal breath sounds clear to auscultation       Cardiovascular Exercise Tolerance: Good hypertension, (-) angina(-) Past MI and (-) Cardiac Stents Normal cardiovascular exam(-) dysrhythmias (-) Valvular Problems/Murmurs Rhythm:regular Rate:Normal     Neuro/Psych negative neurological ROS  negative psych ROS   GI/Hepatic negative GI ROS, Neg liver ROS,   Endo/Other  negative endocrine ROS  Renal/GU negative Renal ROS  negative genitourinary   Musculoskeletal   Abdominal   Peds  Hematology negative hematology ROS (+)   Anesthesia Other Findings History reviewed. No pertinent past medical history.   Reproductive/Obstetrics negative OB ROS                             Anesthesia Physical Anesthesia Plan  ASA: 2  Anesthesia Plan: General   Post-op Pain Management:    Induction: Intravenous  PONV Risk Score and Plan: 2 and Ondansetron, Dexamethasone, Midazolam, Treatment may vary due to age or medical condition and Promethazine  Airway Management Planned: Oral ETT  Additional Equipment:   Intra-op Plan:   Post-operative Plan: Extubation in OR  Informed Consent: I have reviewed the patients History and Physical, chart, labs and discussed the procedure including the risks, benefits and alternatives for the proposed anesthesia with the patient or authorized representative who has indicated  his/her understanding and acceptance.     Dental Advisory Given  Plan Discussed with: Anesthesiologist, CRNA and Surgeon  Anesthesia Plan Comments:         Anesthesia Quick Evaluation

## 2022-01-16 NOTE — Discharge Summary (Signed)
Physician Discharge Summary  Patient ID: Miking Usrey MRN: [id number] DOB/AGE: 1960/10/07 61 y.o.  Admit date: 01/16/2022 Discharge date: 01/16/2022  Admission Diagnoses: Cervical myelopathy G95.9, Degenerative cervical spinal stenosis M48.02, Right arm weakness R29.898  Discharge Diagnoses:  Active Problems:   * No active hospital problems. *   Discharged Condition: good  Hospital Course: ***  Consults: None  Significant Diagnostic Studies: none  Treatments: surgery: as above. Please see separately dictated operative report for further details   Discharge Exam: Blood pressure (!) 144/77, pulse 71, temperature 97.8 F (36.6 C), temperature source Oral, resp. rate 14, height 5\' 6"  (1.676 m), weight 99.8 kg, SpO2 100 %. {physical  Disposition: Discharge disposition: 01-Home or Self Care        Allergies as of 01/16/2022   No Known Allergies      Medication List     STOP taking these medications    acetaminophen 500 MG tablet Commonly known as: TYLENOL   aspirin EC 81 MG tablet   orphenadrine 100 MG tablet Commonly known as: NORFLEX   oxyCODONE 5 MG immediate release tablet Commonly known as: Roxicodone       TAKE these medications    amLODipine 10 MG tablet Commonly known as: NORVASC Take 10 mg by mouth daily.   ELDERBERRY PO Take 1 capsule by mouth daily.   ferrous sulfate 325 (65 FE) MG tablet Take 325 mg by mouth daily with breakfast.   One-A-Day Mens 50+ Tabs Take 1 tablet by mouth daily.   oxyCODONE-acetaminophen 7.5-325 MG tablet Commonly known as: Percocet Take 1 tablet by mouth every 4 (four) hours as needed for up to 5 days for severe pain.   senna 8.6 MG Tabs tablet Commonly known as: SENOKOT Take 1 tablet (8.6 mg total) by mouth daily as needed for mild constipation.   tiZANidine 4 MG tablet Commonly known as: ZANAFLEX Take 4 mg by mouth 3 (three) times daily as needed for muscle spasms.        Follow-up  Information     12-24-1976, [place] Follow up in 2 week(s).   Why: For post-op and incision check. This appointment date and time are on your pre-op paperwork Contact information: [address] [place] [phone]                 Signed: [phone] 01/16/2022, 9:24 AM

## 2022-01-16 NOTE — Progress Notes (Signed)
Pharmacy Antibiotic Note  Corey Patterson is a 61 y.o. male admitted on (Not on file) with surgical prophylaxis.  Pharmacy has been consulted for cefazolin dosing.  Plan: TBW = 103.3 kg   Cefazolin 2 gm IV X 1 60 min pre-op ordered for 5/24 @ 0500.      No data recorded.  No results for input(s): WBC, CREATININE, LATICACIDVEN, VANCOTROUGH, VANCOPEAK, VANCORANDOM, GENTTROUGH, GENTPEAK, GENTRANDOM, TOBRATROUGH, TOBRAPEAK, TOBRARND, AMIKACINPEAK, AMIKACINTROU, AMIKACIN in the last 168 hours.  CrCl cannot be calculated (Patient's most recent lab result is older than the maximum 21 days allowed.).    No Known Allergies  Antimicrobials this admission:   >>    >>   Dose adjustments this admission:   Microbiology results:  BCx:   UCx:    Sputum:    MRSA PCR:   Thank you for allowing pharmacy to be a part of this patient's care.  Saifullah Jolley D 01/16/2022 12:06 AM

## 2022-01-16 NOTE — Discharge Instructions (Addendum)
Your surgeon has performed an operation on your cervical spine (neck) to relieve pressure on the spinal cord and/or nerves. This involved making an incision in the front of your neck and removing one or more of the discs that support your spine. Next, a small piece of bone, a titanium plate, and screws were used to fuse two or more of the vertebrae (bones) together.  The following are instructions to help in your recovery once you have been discharged from the hospital. Even if you feel well, it is important that you follow these activity guidelines. If you do not let your neck heal properly from the surgery, you can increase the chance of return of your symptoms and other complications.  * Do not take anti-inflammatory medications for 3 months after surgery (naproxen [Aleve], ibuprofen [Advil, Motrin].). These medications can prevent your bones from healing properly.   *Please hold home Aspirin for 14 days post-op  Activity    No bending, lifting, or twisting ("BLT"). Avoid lifting objects heavier than 10 pounds (gallon milk jug).  Where possible, avoid household activities that involve lifting, bending, reaching, pushing, or pulling such as laundry, vacuuming, grocery shopping, and childcare. Try to arrange for help from friends and family for these activities while your back heals.  Increase physical activity slowly as tolerated.  Taking short walks is encouraged, but avoid strenuous exercise. Do not jog, run, bicycle, lift weights, or participate in any other exercises unless specifically allowed by your doctor.  Talk to your doctor before resuming sexual activity.  You should not drive until cleared by your doctor.  Until released by your doctor, you should not return to work or school.  You should rest at home and let your body heal.   You may shower three days after your surgery.  After showering, lightly dab your incision dry. Do not take a tub bath or go swimming until approved by your  doctor at your follow-up appointment.  If your doctor ordered a cervical collar (neck brace) for you, you should wear it whenever you are out of bed. You may remove it when lying down or sleeping, but you should wear it at all other times. Not all neck surgeries require a cervical collar.  If you smoke, we strongly recommend that you quit.  Smoking has been proven to interfere with normal bone healing and will dramatically reduce the success rate of your surgery. Please contact [facility] (800-QUIT-NOW) and use the resources at www.[facility].com for assistance in stopping smoking.  Surgical Incision   Keep your incision area clean and dry.  Your incision was closed with Dermabond glue. The glue should begin to peel away within about a week.  Diet           You may return to your usual diet. However, you may experience discomfort when swallowing in the first month after your surgery. This is normal. You may find that softer foods are more comfortable for you to swallow. Be sure to stay hydrated.  When to Contact us  You may experience pain in your neck and/or pain between your shoulder blades. This is normal and should improve in the next few weeks with the help of pain medication, muscle relaxers, and rest. Some patients report that a warm compress on the back of the neck or between the shoulder blades helps.  However, should you experience any of the following, contact us immediately: New numbness or weakness Pain that is progressively getting worse, and is not relieved by  your pain medication, muscle relaxers, rest, and warm compresses Bleeding, redness, swelling, pain, or drainage from surgical incision Chills or flu-like symptoms Fever greater than 101.0 F (38.3 C) Inability to eat, drink fluids, or take medications Problems with bowel or bladder functions Difficulty breathing or shortness of breath Warmth, tenderness, or swelling in your calf Contact Information During office  hours (Monday-Friday 9 am to 5 pm), please call your physician at [phone] and ask for Berdine Addison After hours and weekends, please call [phone] and speak with the answering service, who will contact the doctor on call.  If that fails, call the [place] Operator at [phone] and ask for the Neurosurgery Resident On Call  For a life-threatening emergency, call [place]   The drugs that you were given will stay in your system until tomorrow so for the next 24 hours you should not:  Drive an automobile Make any legal decisions Drink any alcoholic beverage   You may resume regular meals tomorrow.  Today it is better to start with liquids and gradually work up to solid foods.  You may eat anything you prefer, but it is better to start with liquids, then soup and crackers, and gradually work up to solid foods.   Please notify your doctor immediately if you have any unusual bleeding, trouble breathing, redness and pain at the surgery site, drainage, fever, or pain not relieved by medication.    Additional Instructions:        Please contact your physician with any problems or Same Day Surgery at [phone], Monday through Friday 6 am to 4 pm, or [place] at [facility] number at [phone].

## 2022-01-17 ENCOUNTER — Encounter: Payer: Self-pay | Admitting: Neurosurgery

## 2022-02-25 ENCOUNTER — Other Ambulatory Visit: Payer: Self-pay | Admitting: Neurosurgery

## 2022-02-25 DIAGNOSIS — Z981 Arthrodesis status: Secondary | ICD-10-CM

## 2022-02-28 ENCOUNTER — Other Ambulatory Visit: Payer: Self-pay | Admitting: Neurosurgery

## 2022-02-28 ENCOUNTER — Encounter: Payer: Self-pay | Admitting: Neurosurgery

## 2022-02-28 ENCOUNTER — Ambulatory Visit
Admission: RE | Admit: 2022-02-28 | Discharge: 2022-02-28 | Disposition: A | Discharge status: Home / Self Care | Payer: BC Managed Care – PPO | Source: Ambulatory Visit | Attending: Neurosurgery | Admitting: Neurosurgery

## 2022-02-28 ENCOUNTER — Ambulatory Visit: Payer: BC Managed Care – PPO | Admitting: Neurosurgery

## 2022-02-28 ENCOUNTER — Ambulatory Visit
Admission: RE | Admit: 2022-02-28 | Discharge: 2022-02-28 | Disposition: A | Discharge status: Home / Self Care | Payer: BC Managed Care – PPO | Attending: Neurosurgery | Admitting: Neurosurgery

## 2022-02-28 VITALS — BP 141/93 | HR 73 | Temp 98.3°F | Ht 72.0 in | Wt 227.8 lb

## 2022-02-28 DIAGNOSIS — Z981 Arthrodesis status: Secondary | ICD-10-CM

## 2022-02-28 DIAGNOSIS — M4602 Spinal enthesopathy, cervical region: Secondary | ICD-10-CM | POA: Insufficient documentation

## 2022-02-28 NOTE — Progress Notes (Signed)
   DOS: 01/16/22 C3-5 ACDF  HISTORY OF PRESENT ILLNESS: 02/28/2022 [name] is status post anterior cervical discectomy and fusion.  His strength has improved.  He is still having some issues with the strength of his voice and with swallowing.  That has been getting less prominent over time.Marland Kitchen   PHYSICAL EXAMINATION:   Vitals:   02/28/22 1051  BP: (!) 141/93  Pulse: 73  Temp: 98.3 F (36.8 C)   General: Patient is well developed, well nourished, calm, collected, and in no apparent distress.  NEUROLOGICAL:  General: In no acute distress.  Awake, alert, oriented to person, place, and time. Pupils equal round and reactive to light.   Strength: Side Biceps Triceps Deltoid Interossei Grip Wrist Ext. Wrist Flex.  R 4+ 5 4+ 5 5 5 5   L 5 5 5 5 5 5 5    Incision c/d/i   ROS (Neurologic): Negative except as noted above  IMAGING: No complications noted  ASSESSMENT/PLAN:  Corey Patterson is doing well after ACDF.  I think his strength will continue to improve.  He is not ready to return to work.  We will reevaluate his swallowing and voice at his next visit.  If he is still having trouble, I will refer him to head neck surgery at that time.  I will see him back in 6 weeks with x-rays  I spent a total of 10 minutes in face-to-face and non-face-to-face activities related to this patient's care today.   MD, [facility] Department of Neurosurgery

## 2022-04-03 ENCOUNTER — Other Ambulatory Visit: Payer: Self-pay

## 2022-04-03 DIAGNOSIS — Z981 Arthrodesis status: Secondary | ICD-10-CM

## 2022-04-04 ENCOUNTER — Ambulatory Visit
Admission: RE | Admit: 2022-04-04 | Discharge: 2022-04-04 | Disposition: A | Discharge status: Home / Self Care | Payer: BC Managed Care – PPO | Source: Ambulatory Visit | Attending: Family Medicine | Admitting: Family Medicine

## 2022-04-04 ENCOUNTER — Ambulatory Visit: Payer: BC Managed Care – PPO | Admitting: Neurosurgery

## 2022-04-04 ENCOUNTER — Ambulatory Visit
Admission: RE | Admit: 2022-04-04 | Discharge: 2022-04-04 | Disposition: A | Discharge status: Home / Self Care | Payer: BC Managed Care – PPO | Source: Ambulatory Visit | Attending: Neurosurgery | Admitting: Neurosurgery

## 2022-04-04 ENCOUNTER — Encounter: Payer: Self-pay | Admitting: Neurosurgery

## 2022-04-04 VITALS — BP 132/82 | Temp 98.4°F | Ht 72.0 in | Wt 228.0 lb

## 2022-04-04 DIAGNOSIS — Z981 Arthrodesis status: Secondary | ICD-10-CM | POA: Insufficient documentation

## 2022-04-04 NOTE — Progress Notes (Signed)
   DOS: 01/16/22 C3-5 ACDF  HISTORY OF PRESENT ILLNESS: 04/04/2022 He is making progress with physical therapy.  He continues to have some weakness in his grip strength.  Per physical therapy, he is not yet ready to return to work.  02/28/22 [name] is status post anterior cervical discectomy and fusion.  His strength has improved.  He is still having some issues with the strength of his voice and with swallowing.  That has been getting less prominent over time.Marland Kitchen   PHYSICAL EXAMINATION:   Vitals:   04/04/22 1006  BP: 132/82  Temp: 98.4 F (36.9 C)    General: Patient is well developed, well nourished, calm, collected, and in no apparent distress.  NEUROLOGICAL:  General: In no acute distress.  Awake, alert, oriented to person, place, and time. Pupils equal round and reactive to light.   Strength: Side Biceps Triceps Deltoid Interossei Grip Wrist Ext. Wrist Flex.  R 4+ 5 4+ 5 4+ 5 5  L 5 5 5 5 5 5 5    Incision c/d/i   ROS (Neurologic): Negative except as noted above  IMAGING: No complications noted  ASSESSMENT/PLAN:  Corey Patterson is doing well after ACDF.  I think his strength will continue to improve.  He is not ready to return to work.    I will see him back in 3 months with x-rays  I spent a total of 10 minutes in face-to-face and non-face-to-face activities related to this patient's care today.   Letta Kocher MD, [facility] Department of Neurosurgery

## 2022-07-01 ENCOUNTER — Other Ambulatory Visit: Payer: Self-pay

## 2022-07-01 DIAGNOSIS — Z981 Arthrodesis status: Secondary | ICD-10-CM

## 2022-07-04 ENCOUNTER — Encounter: Payer: Self-pay | Admitting: Neurosurgery

## 2022-07-04 ENCOUNTER — Ambulatory Visit
Admission: RE | Admit: 2022-07-04 | Discharge: 2022-07-04 | Disposition: A | Discharge status: Home / Self Care | Payer: BC Managed Care – PPO | Source: Ambulatory Visit | Attending: Neurosurgery | Admitting: Neurosurgery

## 2022-07-04 ENCOUNTER — Ambulatory Visit
Admission: RE | Admit: 2022-07-04 | Discharge: 2022-07-04 | Disposition: A | Discharge status: Home / Self Care | Payer: BC Managed Care – PPO | Attending: Neurosurgery | Admitting: Neurosurgery

## 2022-07-04 ENCOUNTER — Ambulatory Visit (INDEPENDENT_AMBULATORY_CARE_PROVIDER_SITE_OTHER): Payer: BC Managed Care – PPO | Admitting: Neurosurgery

## 2022-07-04 VITALS — BP 128/80 | Ht 72.0 in | Wt 228.0 lb

## 2022-07-04 DIAGNOSIS — Z09 Encounter for follow-up examination after completed treatment for conditions other than malignant neoplasm: Secondary | ICD-10-CM | POA: Diagnosis not present

## 2022-07-04 DIAGNOSIS — Z981 Arthrodesis status: Secondary | ICD-10-CM

## 2022-07-04 DIAGNOSIS — G959 Disease of spinal cord, unspecified: Secondary | ICD-10-CM

## 2022-07-04 DIAGNOSIS — M4802 Spinal stenosis, cervical region: Secondary | ICD-10-CM | POA: Diagnosis not present

## 2022-07-04 NOTE — Progress Notes (Signed)
   DOS: 01/16/22 C3-5 ACDF  HISTORY OF PRESENT ILLNESS: 07/04/2022 Corey Patterson continues to improve.  He continues to have some neck soreness.  His strength has shown slow improvement.  04/04/22 He is making progress with physical therapy.  He continues to have some weakness in his grip strength.  Per physical therapy, he is not yet ready to return to work.  02/28/22 Corey. Corey Patterson is status post anterior cervical discectomy and fusion.  His strength has improved.  He is still having some issues with the strength of his voice and with swallowing.  That has been getting less prominent over time.Marland Kitchen   PHYSICAL EXAMINATION:   Vitals:   07/04/22 1038  BP: 128/80    General: Patient is well developed, well nourished, calm, collected, and in no apparent distress.  NEUROLOGICAL:  General: In no acute distress.  Awake, alert, oriented to person, place, and time. Pupils equal round and reactive to light.   Strength: Side Biceps Triceps Deltoid Interossei Grip Wrist Ext. Wrist Flex.  R 4+ 5 4+ 5 4+ 5 5  L 5 5 5 5 5 5 5    Incision c/d/i   ROS (Neurologic): Negative except as noted above  IMAGING: No complications noted  ASSESSMENT/PLAN:  Corey Patterson is doing well after ACDF.  I think his strength will continue to improve.  He will return to work with restrictions in a couple of weeks.  I will see him back in 6 months with x-rays.  I suggested that he try ibuprofen if released from his primary doctor in addition to acetaminophen for his neck discomfort.  I spent a total of 10 minutes in face-to-face and non-face-to-face activities related to this patient's care today.   Letta Kocher MD, [facility] Department of Neurosurgery

## 2023-01-01 ENCOUNTER — Other Ambulatory Visit: Payer: Self-pay

## 2023-01-01 DIAGNOSIS — G959 Disease of spinal cord, unspecified: Secondary | ICD-10-CM

## 2023-01-02 ENCOUNTER — Ambulatory Visit
Admission: RE | Admit: 2023-01-02 | Discharge: 2023-01-02 | Disposition: A | Discharge status: Home / Self Care | Payer: BC Managed Care – PPO | Source: Ambulatory Visit | Attending: Neurosurgery | Admitting: Neurosurgery

## 2023-01-02 ENCOUNTER — Ambulatory Visit (INDEPENDENT_AMBULATORY_CARE_PROVIDER_SITE_OTHER): Payer: BC Managed Care – PPO | Admitting: Neurosurgery

## 2023-01-02 ENCOUNTER — Encounter: Payer: Self-pay | Admitting: Neurosurgery

## 2023-01-02 VITALS — BP 130/80 | HR 72 | Wt 228.0 lb

## 2023-01-02 DIAGNOSIS — Z09 Encounter for follow-up examination after completed treatment for conditions other than malignant neoplasm: Secondary | ICD-10-CM | POA: Diagnosis not present

## 2023-01-02 DIAGNOSIS — G959 Disease of spinal cord, unspecified: Secondary | ICD-10-CM

## 2023-01-02 NOTE — Progress Notes (Signed)
   DOS: 01/16/22 C3-5 ACDF  HISTORY OF PRESENT ILLNESS: 01/02/2023 [name] presents for follow-up.  He has occasional discomfort in the left side of his neck when he rotates.  His strength is doing well.  07/04/2022 [name] continues to improve.  He continues to have some neck soreness.  His strength has shown slow improvement.  04/04/22 He is making progress with physical therapy.  He continues to have some weakness in his grip strength.  Per physical therapy, he is not yet ready to return to work.  02/28/22 [name] is status post anterior cervical discectomy and fusion.  His strength has improved.  He is still having some issues with the strength of his voice and with swallowing.  That has been getting less prominent over time.Marland Kitchen   PHYSICAL EXAMINATION:   Vitals:   01/02/23 0929  BP: (!) 139/100  Pulse: 77    General: Patient is well developed, well nourished, calm, collected, and in no apparent distress.  NEUROLOGICAL:  General: In no acute distress.  Awake, alert, oriented to person, place, and time. Pupils equal round and reactive to light.   Strength: Side Biceps Triceps Deltoid Interossei Grip Wrist Ext. Wrist Flex.  R 5 5 5 5 5 5 5   L 5 5 5 5 5 5 5    Incision c/d/i   ROS (Neurologic): Negative except as noted above  IMAGING: No complications noted.  He is fused at C3-4 and almost fused at C4-5.  ASSESSMENT/PLAN:  Corey Patterson is doing well after ACDF.  I am pleased with his response to surgery.  Will see him back on an as-needed basis.  He can continue using medications as needed for discomfort.  I spent a total of 10 minutes in face-to-face and non-face-to-face activities related to this patient's care today.   Venetia Night MD, [facility] Department of Neurosurgery

## 2023-07-21 ENCOUNTER — Encounter: Payer: Self-pay | Admitting: *Deleted

## 2023-08-18 ENCOUNTER — Encounter: Payer: Self-pay | Admitting: *Deleted

## 2023-08-22 ENCOUNTER — Other Ambulatory Visit: Payer: Self-pay

## 2023-08-22 ENCOUNTER — Encounter
Admission: RE | Disposition: A | Discharge status: Home / Self Care | Payer: Self-pay | Source: Home / Self Care | Attending: Gastroenterology

## 2023-08-22 ENCOUNTER — Ambulatory Visit: Payer: BC Managed Care – PPO | Admitting: Registered Nurse

## 2023-08-22 ENCOUNTER — Ambulatory Visit
Admission: RE | Admit: 2023-08-22 | Discharge: 2023-08-22 | Disposition: A | Discharge status: Home / Self Care | Payer: BC Managed Care – PPO | Attending: Gastroenterology | Admitting: Gastroenterology

## 2023-08-22 ENCOUNTER — Encounter: Payer: Self-pay | Admitting: *Deleted

## 2023-08-22 DIAGNOSIS — K640 First degree hemorrhoids: Secondary | ICD-10-CM | POA: Diagnosis not present

## 2023-08-22 DIAGNOSIS — Z800 Family history of malignant neoplasm of digestive organs: Secondary | ICD-10-CM | POA: Diagnosis not present

## 2023-08-22 DIAGNOSIS — Z1211 Encounter for screening for malignant neoplasm of colon: Secondary | ICD-10-CM | POA: Insufficient documentation

## 2023-08-22 DIAGNOSIS — I10 Essential (primary) hypertension: Secondary | ICD-10-CM | POA: Insufficient documentation

## 2023-08-22 DIAGNOSIS — D122 Benign neoplasm of ascending colon: Secondary | ICD-10-CM | POA: Diagnosis not present

## 2023-08-22 HISTORY — PX: POLYPECTOMY: SHX5525

## 2023-08-22 HISTORY — PX: COLONOSCOPY WITH PROPOFOL: SHX5780

## 2023-08-22 HISTORY — DX: Other seasonal allergic rhinitis: J30.2

## 2023-08-22 SURGERY — COLONOSCOPY WITH PROPOFOL
Anesthesia: General

## 2023-08-22 MED ORDER — PHENYLEPHRINE HCL (PRESSORS) 10 MG/ML IV SOLN
INTRAVENOUS | Status: DC | PRN
  Administered 2023-08-22: 120 ug via INTRAVENOUS

## 2023-08-22 MED ORDER — PROPOFOL 1000 MG/100ML IV EMUL
INTRAVENOUS | Status: AC
Start: 2023-08-22 — End: ?
  Filled 2023-08-22: qty 100

## 2023-08-22 MED ORDER — PROPOFOL 10 MG/ML IV BOLUS
INTRAVENOUS | Status: DC | PRN
  Administered 2023-08-22: 120 mg via INTRAVENOUS

## 2023-08-22 MED ORDER — SODIUM CHLORIDE 0.9 % IV SOLN: INTRAVENOUS | Status: DC

## 2023-08-22 MED ORDER — PHENYLEPHRINE 80 MCG/ML (10ML) SYRINGE FOR IV PUSH (FOR BLOOD PRESSURE SUPPORT)
PREFILLED_SYRINGE | INTRAVENOUS | Status: AC
Start: 2023-08-22 — End: ?
  Filled 2023-08-22: qty 10

## 2023-08-22 MED ORDER — LIDOCAINE HCL (PF) 2 % IJ SOLN
INTRAMUSCULAR | Status: AC
Start: 2023-08-22 — End: ?
  Filled 2023-08-22: qty 5

## 2023-08-22 MED ORDER — PROPOFOL 500 MG/50ML IV EMUL
INTRAVENOUS | Status: DC | PRN
  Administered 2023-08-22: 125 ug/kg/min via INTRAVENOUS

## 2023-08-22 MED ORDER — LIDOCAINE HCL (CARDIAC) PF 100 MG/5ML IV SOSY
PREFILLED_SYRINGE | INTRAVENOUS | Status: DC | PRN
  Administered 2023-08-22: 20 mg via INTRAVENOUS

## 2023-08-22 NOTE — Anesthesia Preprocedure Evaluation (Signed)
Anesthesia Evaluation  Patient identified by MRN, date of birth, ID band Patient awake    Reviewed: Allergy & Precautions, NPO status , Patient's Chart, lab work & pertinent test results  Airway Mallampati: II  TM Distance: >3 FB Neck ROM: full    Dental  (+) Teeth Intact, Upper Dentures   Pulmonary neg pulmonary ROS   Pulmonary exam normal breath sounds clear to auscultation       Cardiovascular Exercise Tolerance: Good hypertension, Pt. on medications negative cardio ROS Normal cardiovascular exam Rhythm:Regular Rate:Normal     Neuro/Psych negative neurological ROS  negative psych ROS   GI/Hepatic negative GI ROS, Neg liver ROS,,,  Endo/Other  negative endocrine ROS    Renal/GU negative Renal ROS  negative genitourinary   Musculoskeletal negative musculoskeletal ROS (+)    Abdominal Normal abdominal exam  (+)   Peds negative pediatric ROS (+)  Hematology negative hematology ROS (+)   Anesthesia Other Findings Past Medical History: No date: Hypertension No date: Seasonal allergic rhinitis  Past Surgical History: 01/16/2022: ANTERIOR CERVICAL DECOMP/DISCECTOMY FUSION; N/A     Comment:  Procedure: C3-5 ANTERIOR CERVICAL DISCECTOMY AND FUSION               (GLOBUS HEDRON);  Surgeon: Venetia Night, MD;                Location: [facility] ORS;  Service: Neurosurgery;  Laterality:               N/A; No date: HERNIA REPAIR No date: History of tinea cruris  BMI    Body Mass Index: 28.89 kg/m      Reproductive/Obstetrics negative OB ROS                             Anesthesia Physical Anesthesia Plan  ASA: 2  Anesthesia Plan: General   Post-op Pain Management:    Induction: Intravenous  PONV Risk Score and Plan: Propofol infusion and TIVA  Airway Management Planned: Natural Airway and Nasal Cannula  Additional Equipment:   Intra-op Plan:   Post-operative Plan:    Informed Consent: I have reviewed the patients History and Physical, chart, labs and discussed the procedure including the risks, benefits and alternatives for the proposed anesthesia with the patient or authorized representative who has indicated his/her understanding and acceptance.     Dental Advisory Given  Plan Discussed with: Anesthesiologist, CRNA and Surgeon  Anesthesia Plan Comments: (Patient consented for risks of anesthesia including but not limited to:  - adverse reactions to medications - risk of airway placement if required - damage to eyes, teeth, lips or other oral mucosa - nerve damage due to positioning  - sore throat or hoarseness - Damage to heart, brain, nerves, lungs, other parts of body or loss of life  Patient voiced understanding and assent.)       Anesthesia Quick Evaluation

## 2023-08-22 NOTE — H&P (Signed)
Outpatient short stay form Pre-procedure 08/22/2023  Corey Bill, MD  Primary Physician: Kandyce Rud, MD  Reason for visit:  Screening  History of present illness:    62 y/o gentleman with history of hypertension here for screening colonoscopy. No blood thinners. No significant abdominal surgeries. Father had colon cancer in his 68's.    Current Facility-Administered Medications:    0.9 %  sodium chloride infusion, , Intravenous, Continuous, Markeesha Char, Rossie Muskrat, MD, Last Rate: 20 mL/hr at 08/22/23 0730, New Bag at 08/22/23 0730  Medications Prior to Admission  Medication Sig Dispense Refill Last Dose/Taking   amLODipine (NORVASC) 10 MG tablet Take 10 mg by mouth daily.   08/22/2023 Morning   fluticasone (FLONASE) 50 MCG/ACT nasal spray Place 2 sprays into both nostrils daily.   Taking   Acetaminophen (TYLENOL PO) Take by mouth as needed.      ELDERBERRY PO Take 1 capsule by mouth daily.      ferrous sulfate 325 (65 FE) MG tablet Take 325 mg by mouth daily with breakfast.      meloxicam (MOBIC) 15 MG tablet Take 15 mg by mouth as needed.      Multiple Vitamins-Minerals (ONE-A-DAY MENS 50+) TABS Take 1 tablet by mouth daily.      tiZANidine (ZANAFLEX) 4 MG tablet Take 4 mg by mouth 3 (three) times daily as needed for muscle spasms.        No Known Allergies   Past Medical History:  Diagnosis Date   Hypertension    Seasonal allergic rhinitis     Review of systems:  Otherwise negative.    Physical Exam  Gen: Alert, oriented. Appears stated age.  HEENT: PERRLA. Lungs: No respiratory distress CV: RRR Abd: soft, benign, no masses Ext: No edema    Planned procedures: Proceed with colonoscopy. The patient understands the nature of the planned procedure, indications, risks, alternatives and potential complications including but not limited to bleeding, infection, perforation, damage to internal organs and possible oversedation/side effects from anesthesia. The  patient agrees and gives consent to proceed.  Please refer to procedure notes for findings, recommendations and patient disposition/instructions.     Corey Bill, MD [facility]

## 2023-08-22 NOTE — Interval H&P Note (Signed)
History and Physical Interval Note:  08/22/2023 8:00 AM  Corey Patterson  has presented today for surgery, with the diagnosis of Z12.11 (ICD-10-CM) - Colon cancer screening Z80.0 (ICD-10-CM) - Family history of colon cancer.  The various methods of treatment have been discussed with the patient and family. After consideration of risks, benefits and other options for treatment, the patient has consented to  Procedure(s): COLONOSCOPY WITH PROPOFOL (N/A) as a surgical intervention.  The patient's history has been reviewed, patient examined, no change in status, stable for surgery.  I have reviewed the patient's chart and labs.  Questions were answered to the patient's satisfaction.     Regis Bill  Ok to proceed with colonoscopy

## 2023-08-22 NOTE — Op Note (Signed)
[facility] Gastroenterology Patient Name: Corey Patterson Procedure Date: 08/22/2023 7:55 AM MRN: [id number] Account #: [id number] Date of Birth: [birth year] Admit Type: Outpatient Age: 62 Room: [facility] ENDO ROOM 3 Gender: Male Note Status: Finalized Instrument Name: Prentice Docker [id number] Procedure:             Colonoscopy Indications:           Screening for colorectal malignant neoplasm Providers:             Eather Colas MD, MD Referring MD:          Hassell Halim MD (Referring MD) Medicines:             Monitored Anesthesia Care Complications:         No immediate complications. Estimated blood loss:                         Minimal. Procedure:             Pre-Anesthesia Assessment:                        - Prior to the procedure, a History and Physical was                         performed, and patient medications and allergies were                         reviewed. The patient is competent. The risks and                         benefits of the procedure and the sedation options and                         risks were discussed with the patient. All questions                         were answered and informed consent was obtained.                         Patient identification and proposed procedure were                         verified by the physician, the nurse, the                         anesthesiologist, the anesthetist and the technician                         in the endoscopy suite. Mental Status Examination:                         alert and oriented. Airway Examination: normal                         oropharyngeal airway and neck mobility. Respiratory                         Examination: clear to auscultation. CV Examination:  normal. Prophylactic Antibiotics: The patient does not                         require prophylactic antibiotics. Prior                         Anticoagulants: The patient has taken no anticoagulant                          or antiplatelet agents. ASA Grade Assessment: II - A                         patient with mild systemic disease. After reviewing                         the risks and benefits, the patient was deemed in                         satisfactory condition to undergo the procedure. The                         anesthesia plan was to use monitored anesthesia care                         (MAC). Immediately prior to administration of                         medications, the patient was re-assessed for adequacy                         to receive sedatives. The heart rate, respiratory                         rate, oxygen saturations, blood pressure, adequacy of                         pulmonary ventilation, and response to care were                         monitored throughout the procedure. The physical                         status of the patient was re-assessed after the                         procedure.                        After obtaining informed consent, the colonoscope was                         passed under direct vision. Throughout the procedure,                         the patient's blood pressure, pulse, and oxygen                         saturations were monitored continuously. The  Colonoscope was introduced through the anus and                         advanced to the the terminal ileum, with                         identification of the appendiceal orifice and IC                         valve. The colonoscopy was performed without                         difficulty. The patient tolerated the procedure well.                         The quality of the bowel preparation was good. The                         terminal ileum, ileocecal valve, appendiceal orifice,                         and rectum were photographed. Findings:      The perianal and digital rectal examinations were normal.      The terminal ileum appeared normal.      An 8 mm polyp  was found in the ascending colon. The polyp was sessile.       The polyp was removed with a cold snare. Resection and retrieval were       complete. Estimated blood loss was minimal.      Internal hemorrhoids were found during retroflexion. The hemorrhoids       were Grade I (internal hemorrhoids that do not prolapse).      The exam was otherwise without abnormality on direct and retroflexion       views. Impression:            - The examined portion of the ileum was normal.                        - One 8 mm polyp in the ascending colon, removed with                         a cold snare. Resected and retrieved.                        - Internal hemorrhoids.                        - The examination was otherwise normal on direct and                         retroflexion views. Recommendation:        - Discharge patient to home.                        - Resume previous diet.                        - Continue present medications.                        -  Await pathology results.                        - Repeat colonoscopy in 7-10 years for surveillance.                        - Return to referring physician as previously                         scheduled. Procedure Code(s):     --- Professional ---                        [phone], Colonoscopy, flexible; with removal of                         tumor(s), polyp(s), or other lesion(s) by snare                         technique Diagnosis Code(s):     --- Professional ---                        Z12.11, Encounter for screening for malignant neoplasm                         of colon                        K64.0, First degree hemorrhoids                        D12.2, Benign neoplasm of ascending colon CPT copyright 2022 [employer]. All rights reserved. The codes documented in this report are preliminary and upon coder review may  be revised to meet current compliance requirements. Eather Colas MD, MD 08/22/2023 8:29:41  AM Number of Addenda: 0 Note Initiated On: 08/22/2023 7:55 AM Scope Withdrawal Time: 0 hours 8 minutes 23 seconds  Total Procedure Duration: 0 hours 10 minutes 40 seconds  Estimated Blood Loss:  Estimated blood loss was minimal.      [facility]

## 2023-08-22 NOTE — Anesthesia Postprocedure Evaluation (Signed)
Anesthesia Post Note  Patient: Corey Patterson  Procedure(s) Performed: COLONOSCOPY WITH PROPOFOL POLYPECTOMY  Patient location during evaluation: PACU Anesthesia Type: General Level of consciousness: awake and awake and alert Pain management: pain level controlled Vital Signs Assessment: post-procedure vital signs reviewed and stable Respiratory status: spontaneous breathing Cardiovascular status: blood pressure returned to baseline Anesthetic complications: no   No notable events documented.   Last Vitals:  Vitals:   08/22/23 0840 08/22/23 0850  BP: 105/70 (!) 112/91  Pulse:    Resp:    Temp:    SpO2:      Last Pain:  Vitals:   08/22/23 0850  TempSrc:   PainSc: 0-No pain                 VAN STAVEREN,Buford Gayler

## 2023-08-22 NOTE — Transfer of Care (Addendum)
Immediate Anesthesia Transfer of Care Note  Patient: Corey Patterson  Procedure(s) Performed: COLONOSCOPY WITH PROPOFOL POLYPECTOMY  Patient Location: PACU  Anesthesia Type:General  Level of Consciousness: drowsy  Airway & Oxygen Therapy: Patient Spontanous Breathing and Patient connected to face mask oxygen  Post-op Assessment: Report given to RN and Post -op Vital signs reviewed and stable  Post vital signs: Reviewed and stable  Last Vitals:  Vitals Value Taken Time  BP 106/79 (89) 08/22/23 0834  Temp 97   Pulse 75 08/22/23 0831  Resp 17 08/22/23 0831  SpO2 98 % 08/22/23 0831  Vitals shown include unfiled device data.  Last Pain:  Vitals:   08/22/23 0718  TempSrc: Temporal  PainSc: 0-No pain         Complications: No notable events documented.

## 2023-08-25 ENCOUNTER — Encounter: Payer: Self-pay | Admitting: Gastroenterology

## 2023-08-25 LAB — SURGICAL PATHOLOGY

## 2024-04-18 ENCOUNTER — Emergency Department: Payer: Self-pay

## 2024-04-18 ENCOUNTER — Other Ambulatory Visit: Payer: Self-pay

## 2024-04-18 ENCOUNTER — Emergency Department
Admission: EM | Admit: 2024-04-18 | Discharge: 2024-04-18 | Disposition: A | Discharge status: Home / Self Care | Payer: Self-pay | Attending: Emergency Medicine | Admitting: Emergency Medicine

## 2024-04-18 DIAGNOSIS — M545 Low back pain, unspecified: Secondary | ICD-10-CM | POA: Insufficient documentation

## 2024-04-18 DIAGNOSIS — I10 Essential (primary) hypertension: Secondary | ICD-10-CM | POA: Insufficient documentation

## 2024-04-18 DIAGNOSIS — W1839XA Other fall on same level, initial encounter: Secondary | ICD-10-CM | POA: Insufficient documentation

## 2024-04-18 DIAGNOSIS — M25551 Pain in right hip: Secondary | ICD-10-CM | POA: Insufficient documentation

## 2024-04-18 MED ORDER — MELOXICAM 15 MG PO TABS
15.0000 mg | ORAL_TABLET | Freq: Every day | ORAL | 0 refills | Status: AC
Start: 2024-04-18 — End: 2024-05-18

## 2024-04-18 MED ORDER — LIDOCAINE 5 % EX PTCH
1.0000 | MEDICATED_PATCH | CUTANEOUS | Status: DC
  Administered 2024-04-18: 1 via TRANSDERMAL
  Filled 2024-04-18: qty 1

## 2024-04-18 MED ORDER — KETOROLAC TROMETHAMINE 30 MG/ML IJ SOLN
30.0000 mg | Freq: Once | INTRAMUSCULAR | Status: AC
  Administered 2024-04-18: 30 mg via INTRAMUSCULAR
  Filled 2024-04-18: qty 1

## 2024-04-18 MED ORDER — CYCLOBENZAPRINE HCL 5 MG PO TABS: 5.0000 mg | ORAL_TABLET | Freq: Three times a day (TID) | ORAL | 0 refills | Status: AC | PRN

## 2024-04-18 NOTE — ED Provider Notes (Signed)
 [facility] Emergency Department Provider Note     Event Date/Time   First MD Initiated Contact with Patient 04/18/24 1233     (approximate)   History   Hip Pain   HPI  Corey Patterson is a 63 y.o. male with a history of HTN presents to the ED for evaluation of right sided hip pain following a fall a couple weeks ago.  Patient reports he was cleaning floors and fell backward from ground-level onto his buttocks.  Since the incident he reports severe pain to the right hip with radiation down his leg.  Denies saddle anesthesia and loss of bowel and bladder control.  Has tried Tylenol  and ibuprofen with no relief.  Patient remains ambulatory.    Physical Exam   Triage Vital Signs: ED Triage Vitals  Encounter Vitals Group     BP 04/18/24 1131 136/82     Girls Systolic BP Percentile --      Girls Diastolic BP Percentile --      Boys Systolic BP Percentile --      Boys Diastolic BP Percentile --      Pulse Rate 04/18/24 1131 70     Resp 04/18/24 1131 18     Temp 04/18/24 1131 98.8 F (37.1 C)     Temp Source 04/18/24 1131 Oral     SpO2 04/18/24 1131 98 %     Weight 04/18/24 1132 228 lb (103.4 kg)     Height 04/18/24 1132 6' (1.829 m)     Head Circumference --      Peak Flow --      Pain Score 04/18/24 1132 8     Pain Loc --      Pain Education --      Exclude from Growth Chart --     Most recent vital signs: Vitals:   04/18/24 1131  BP: 136/82  Pulse: 70  Resp: 18  Temp: 98.8 F (37.1 C)  SpO2: 98%    General Awake, no distress.  HEENT NCAT.  CV:  Good peripheral perfusion.  RESP:  Normal effort.  ABD:  No distention.  Other:  No deformity to right hip.  Tenderness to palpation over right side lumbar region. Good active hip flexion and extension. 5/5 LE strength bilaterally. Gait is steady.    ED Results / Procedures / Treatments   Labs (all labs ordered are listed, but only abnormal results are displayed) Labs Reviewed - No data  to display  RADIOLOGY  I personally viewed and evaluated these images as part of my medical decision making, as well as reviewing the written report by the radiologist.  ED Provider Interpretation: No acute bony abnormality noted to right hip  DG Hip Unilat  With Pelvis 2-3 Views Right Result Date: 04/18/2024 CLINICAL DATA:  Pain.  History of fall from ladder a few weeks ago. EXAM: DG HIP (WITH OR WITHOUT PELVIS) 2-3V RIGHT COMPARISON:  None Available. FINDINGS: No acute fracture or dislocation. Femoral heads are seated within the acetabula. Mild-to-moderate joint space narrowing of the bilateral hips. Sacroiliac joints and no acute osseous abnormality pubic symphysis are anatomically aligned. IMPRESSION: 1. No acute osseous abnormality. 2. Mild-to-moderate joint space narrowing of the bilateral hips. Electronically Signed   By: Harrietta Sherry M.D.   On: 04/18/2024 12:12    PROCEDURES:  Critical Care performed: No  Procedures   MEDICATIONS ORDERED IN ED: Medications  lidocaine  (LIDODERM ) 5 % 1 patch (1 patch Transdermal Patch Applied 04/18/24  1252)  ketorolac  (TORADOL ) 30 MG/ML injection 30 mg (30 mg Intramuscular Given 04/18/24 1252)     IMPRESSION / MDM / ASSESSMENT AND PLAN / ED COURSE  I reviewed the triage vital signs and the nursing notes.                                 63 y.o. male presents to the emergency department for evaluation and treatment of right hip pain. See HPI for further details.   Differential diagnosis includes, but is not limited to fracture, dislocation, strain  Patient's presentation is most consistent with acute complicated illness / injury requiring diagnostic workup.  Patient is alert and oriented.  He is hemodynamically stable.  Physical exam findings are as stated above.  Reassuring x-ray.  Patient is ambulatory.  Pain control in ED with Toradol  and Flexeril .  Will continue anti-inflammatories and muscle relaxant for outpatient management.   Patient is stable for discharge and outpatient follow-up with primary care provider.  RICE education provided.  ED precaution discussed.  FINAL CLINICAL IMPRESSION(S) / ED DIAGNOSES   Final diagnoses:  Right hip pain   Rx / DC Orders   ED Discharge Orders          Ordered    meloxicam  (MOBIC ) 15 MG tablet  Daily        04/18/24 1243    cyclobenzaprine  (FLEXERIL ) 5 MG tablet  3 times daily PRN        04/18/24 1243           Note:  This document was prepared using Dragon voice recognition software and may include unintentional dictation errors.    Margrette, Markeese Boyajian A, PA-C 04/18/24 AUGUSTIN    Arlander Charleston, MD 04/18/24 RONOLD

## 2024-04-18 NOTE — ED Triage Notes (Signed)
 Pt presents to the ED via POV from home for right sided hip pain. Pt reports falling a couple of weeks ago and states that the pain has worsened. Pt states that he feels like he broke his hip. Pot ambulatory to triage room.

## 2024-04-18 NOTE — ED Notes (Signed)
 See triage note  Presents with pain to right hip area  States he fell about 2 weeks ago   Cont's to have pain to right hip area  Pt is ambulatory with some pain

## 2024-04-18 NOTE — Discharge Instructions (Signed)
 Your evaluated in the ED for right hip pain.  Your x-rays are normal and show no fracture or broken bone.  Please follow-up with your primary care provider for further evaluation.  Pain control:  Ibuprofen (motrin/aleve/advil) - You can take 3 tablets (600 mg) every 6 hours as needed for pain/fever. (Do not take this medication with the meloxicam  prescribed to you) Acetaminophen  (tylenol ) - You can take 2 extra strength tablets (1000 mg) every 6 hours as needed for pain/fever.  You can alternate these medications or take them together.  Make sure you eat food/drink water when taking these medications.
